# Patient Record
Sex: Male | Born: 2000 | Race: White | Hispanic: No | Marital: Married | Smoking: Never smoker
Health system: Southern US, Community
[De-identification: ages and names within clinical notes are randomized; demographics above are authoritative.]

## PROBLEM LIST (undated history)

## (undated) DIAGNOSIS — I498 Other specified cardiac arrhythmias: Secondary | ICD-10-CM

## (undated) DIAGNOSIS — R000 Tachycardia, unspecified: Secondary | ICD-10-CM

## (undated) DIAGNOSIS — G90A Postural orthostatic tachycardia syndrome (POTS): Secondary | ICD-10-CM

## (undated) DIAGNOSIS — I079 Rheumatic tricuspid valve disease, unspecified: Secondary | ICD-10-CM

## (undated) DIAGNOSIS — I951 Orthostatic hypotension: Secondary | ICD-10-CM

## (undated) DIAGNOSIS — J45909 Unspecified asthma, uncomplicated: Secondary | ICD-10-CM

---
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
## 2019-05-15 ENCOUNTER — Other Ambulatory Visit: Payer: Self-pay

## 2019-05-15 ENCOUNTER — Ambulatory Visit (HOSPITAL_COMMUNITY)
Admission: EM | Admit: 2019-05-15 | Discharge: 2019-05-15 | Disposition: A | Discharge status: Home / Self Care | Payer: Medicaid Other | Attending: Family Medicine | Admitting: Family Medicine

## 2019-05-15 ENCOUNTER — Encounter (HOSPITAL_COMMUNITY): Payer: Self-pay | Admitting: Emergency Medicine

## 2019-05-15 DIAGNOSIS — Z20822 Contact with and (suspected) exposure to covid-19: Secondary | ICD-10-CM

## 2019-05-15 DIAGNOSIS — Z20828 Contact with and (suspected) exposure to other viral communicable diseases: Secondary | ICD-10-CM | POA: Insufficient documentation
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
## 2019-05-15 NOTE — Discharge Instructions (Addendum)
If your Covid-19 test is positive, you will get a phone call from [facility] regarding your results. If your Covid-19 test is negative, you will NOT get a phone call from [facility] with your results. You may view your results on MyChart. If you do not have a MyChart account, sign up instructions are in your discharge papers. ° °

## 2019-05-15 NOTE — ED Notes (Signed)
Specimen in lab 

## 2019-05-16 NOTE — ED Provider Notes (Signed)
Robert Crane   [id number] 05/15/19 Arrival Time: 1847  ASSESSMENT & PLAN:  1. Exposure to COVID-19 virus     COVID-19 testing sent. To self-quarantine until results are available.  Follow-up Information    Remi Haggard, FNP.   Specialty: Family Medicine Why: As needed. Contact information: [place] [address] [phone]           Reviewed expectations re: course of current medical issues. Questions answered. Outlined signs and symptoms indicating need for more acute intervention. Patient verbalized understanding. After Visit Summary given.   SUBJECTIVE: History from: patient. Robert Crane is a 18 y.o. male who requests COVID-19 testing. Known COVID-19 contact: questions if co-worker tested positive. Recent travel: none. Denies: runny nose, congestion, fever, cough, sore throat, difficulty breathing and headache.  ROS: As per HPI.   OBJECTIVE:  Vitals:   05/15/19 1917  BP: 122/70  Pulse: 77  Resp: 16  Temp: 99.4 F (37.4 C)  TempSrc: Oral  SpO2: 100%    General appearance: alert; no distress Eyes: PERRLA; EOMI; conjunctiva normal HENT: [place]; AT; nasal mucosa normal; oral mucosa normal Neck: supple  Lungs: clear to auscultation bilaterally; unlabored Heart: regular rate and rhythm Abdomen: soft, non-tender Extremities: no edema Skin: warm and dry Neurologic: normal gait Psychological: alert and cooperative; normal mood and affect  Labs: Labs Reviewed  NOVEL CORONAVIRUS, NAA (HOSP ORDER, SEND-OUT TO REF LAB; TAT 18-24 HRS)     No Known Allergies  Past Medical History:  Diagnosis Date  . Asthma   . POTS (postural orthostatic tachycardia syndrome)   . Tricuspid valvular disorder    Social History   Socioeconomic History  . Marital status: Married    Spouse name: Not on file  . Number of children: Not on file  . Years of education: Not on file  . Highest education level: Not on file  Occupational  History  . Not on file  Social Needs  . Financial resource strain: Not on file  . Food insecurity    Worry: Not on file    Inability: Not on file  . Transportation needs    Medical: Not on file    Non-medical: Not on file  Tobacco Use  . Smoking status: Never Smoker  . Smokeless tobacco: Never Used  Substance and Sexual Activity  . Alcohol use: No  . Drug use: No  . Sexual activity: Not on file  Lifestyle  . Physical activity    Days per week: Not on file    Minutes per session: Not on file  . Stress: Not on file  Relationships  . Social Herbalist on phone: Not on file    Gets together: Not on file    Attends religious service: Not on file    Active member of club or organization: Not on file    Attends meetings of clubs or organizations: Not on file    Relationship status: Not on file  . Intimate partner violence    Fear of current or ex partner: Not on file    Emotionally abused: Not on file    Physically abused: Not on file    Forced sexual activity: Not on file  Other Topics Concern  . Not on file  Social History Narrative  . Not on file   Family History  Problem Relation Age of Onset  . Healthy Mother    History reviewed. No pertinent surgical history.   Vanessa Kick, MD 05/16/19  0935  

## 2019-05-18 LAB — NOVEL CORONAVIRUS, NAA (HOSP ORDER, SEND-OUT TO REF LAB; TAT 18-24 HRS): SARS-CoV-2, NAA: NOT DETECTED

## 2019-05-22 ENCOUNTER — Other Ambulatory Visit: Payer: Self-pay

## 2019-05-22 ENCOUNTER — Encounter (HOSPITAL_COMMUNITY): Payer: Self-pay

## 2019-05-22 ENCOUNTER — Ambulatory Visit (HOSPITAL_COMMUNITY)
Admission: EM | Admit: 2019-05-22 | Discharge: 2019-05-22 | Disposition: A | Discharge status: Home / Self Care | Payer: Medicaid Other | Attending: Family Medicine | Admitting: Family Medicine

## 2019-05-22 DIAGNOSIS — J069 Acute upper respiratory infection, unspecified: Secondary | ICD-10-CM | POA: Diagnosis not present

## 2019-05-22 MED ORDER — BENZONATATE 200 MG PO CAPS: 200.0000 mg | ORAL_CAPSULE | Freq: Three times a day (TID) | ORAL | 0 refills | Status: AC | PRN

## 2019-05-22 MED ORDER — FLUTICASONE PROPIONATE 50 MCG/ACT NA SUSP: 1.0000 | Freq: Every day | NASAL | 0 refills | Status: DC

## 2019-05-22 MED ORDER — PREDNISONE 50 MG PO TABS: 50.0000 mg | ORAL_TABLET | Freq: Every day | ORAL | 0 refills | Status: AC

## 2019-05-22 MED ORDER — CETIRIZINE HCL 10 MG PO CAPS: 10.0000 mg | ORAL_CAPSULE | Freq: Every day | ORAL | 0 refills | Status: DC

## 2019-05-22 MED ORDER — DM-GUAIFENESIN ER 30-600 MG PO TB12: 1.0000 | ORAL_TABLET | Freq: Two times a day (BID) | ORAL | 0 refills | Status: DC

## 2019-05-22 NOTE — ED Triage Notes (Signed)
Patient presents to Urgent Care with complaints of sore throat and productive cough since being seen last week. Patient reports he just tested negative for COVID but he still feels badly.

## 2019-05-22 NOTE — Discharge Instructions (Signed)
Please begin daily cetirizine to help with congestion and drainage contributing to sore throat Flonase nasal spray 1 to 2 sprays in each nostril daily Tessalon as needed for cough every 8 hours Mucinex DM twice daily as needed for further congestion/cough Please hold onto prescription for prednisone, may fill if developing worsening wheezing/shortness of breath/asthma Use albuterol inhaler as needed for shortness of breath and wheezing  Please rest and drink plenty of fluids  Please follow-up if symptoms not resolving, worsening, developing fevers, difficulty breathing, shortness of breath, persistent symptoms

## 2019-05-22 NOTE — ED Provider Notes (Signed)
[facility]    CSN: [id number] Arrival date & time: 05/22/19  1733      History   Chief Complaint Chief Complaint  Patient presents with   Sore Throat    negative COVID    HPI [name] is a 18 y.o. male history of asthma, POTS, presenting today for evaluation of cough and sore throat.  Patient states that 2 days ago he developed a sore throat as well as a cough.  Cough has been occasionally productive.  Sore throat is worse in the morning, improves through the day.  He was seen here approximately 1 week ago for Covid exposure, at the time he did not have any symptoms.  He has had some hot and cold chills and felt slightly achy, but denies any known fevers.  He denies shortness of breath, but does note that he has history of asthma.  He is concerned about his symptoms turning into bronchitis/pneumonia.  He has not had significant wheezing or shortness of breath at this time.  He has been using a Mucus Relief tablet as well as DayQuil without relief.  HPI  Past Medical History:  Diagnosis Date   Asthma    POTS (postural orthostatic tachycardia syndrome)    Tricuspid valvular disorder     There are no active problems to display for this patient.   History reviewed. No pertinent surgical history.     Home Medications    Prior to Admission medications   Medication Sig Start Date End Date Taking? Authorizing Provider  midodrine (PROAMATINE) 5 MG tablet Take 5 mg by mouth 3 (three) times daily with meals.   Yes [provider]  PROPRANOLOL HCL PO Take by mouth.   Yes [provider]  albuterol (PROVENTIL HFA;VENTOLIN HFA) 108 (90 BASE) MCG/ACT inhaler Inhale 2 puffs into the lungs every 6 (six) hours as needed for wheezing or shortness of breath.     [provider]  AMITRIPTYLINE HCL PO Take by mouth.    [provider]  atomoxetine (STRATTERA) 80 MG capsule Take 80 mg by mouth daily.    [provider]    benzonatate (TESSALON) 200 MG capsule Take 1 capsule (200 mg total) by mouth 3 (three) times daily as needed for up to 7 days for cough. 05/22/19 05/29/19  [name], PA-C  Cetirizine HCl 10 MG CAPS Take 1 capsule (10 mg total) by mouth daily for 15 days. 05/22/19 06/06/19  [name] C, PA-C  cholecalciferol (VITAMIN D) 1000 UNITS tablet Take 2,000 Units by mouth daily.    [provider]  cyclobenzaprine (FLEXERIL) 10 MG tablet Take 1 tablet (10 mg total) by mouth 2 (two) times daily as needed for muscle spasms. 07/14/17   [name], [place], PA-C  dextromethorphan-guaiFENesin [facility](MUCINEX DM) 30-600 MG 12hr tablet Take 1 tablet by mouth 2 (two) times daily. 05/22/19   [name], PA-C  fludrocortisone (FLORINEF) 0.1 MG tablet Take 0.1 mg by mouth 2 (two) times daily.    [provider]  fluticasone (FLONASE) 50 MCG/ACT nasal spray Place 1-2 sprays into both nostrils daily for 7 days. 05/22/19 05/29/19  [name] C, PA-C  ibuprofen (ADVIL,MOTRIN) 600 MG tablet Take 1 tablet (600 mg total) by mouth every 6 (six) hours as needed. 07/14/17   [name], [place], PA-C  Melatonin 5 MG CAPS Take 5-10 mg by mouth at bedtime as needed (sleep).    [provider]  Multiple Vitamin (MULTIVITAMIN) tablet Take 1 tablet  by mouth daily.    [provider]  NON FORMULARY Anti inflam    [provider]  predniSONE (DELTASONE) 50 MG tablet Take 1 tablet (50 mg total) by mouth daily for 5 days. 05/22/19 05/27/19  [name], [name], PA-C    Family History Family History  Problem Relation Age of Onset   Healthy Mother     Social History Social History   Tobacco Use   Smoking status: Never Smoker   Smokeless tobacco: Never Used  Substance Use Topics   Alcohol use: No   Drug use: No     Allergies   Patient has no known allergies.   Review of Systems Review of Systems  Constitutional: Positive for chills and fatigue. Negative for activity change,  appetite change and fever.  HENT: Positive for congestion and sore throat. Negative for ear pain, rhinorrhea, sinus pressure and trouble swallowing.   Eyes: Negative for discharge and redness.  Respiratory: Positive for cough. Negative for chest tightness and shortness of breath.   Cardiovascular: Negative for chest pain.  Gastrointestinal: Negative for abdominal pain, diarrhea, nausea and vomiting.  Musculoskeletal: Negative for myalgias.  Skin: Negative for rash.  Neurological: Negative for dizziness, light-headedness and headaches.     Physical Exam Triage Vital Signs ED Triage Vitals  Enc Vitals Group     BP 05/22/19 1753 118/62     Pulse Rate 05/22/19 1753 65     Resp 05/22/19 1753 16     Temp 05/22/19 1753 99.2 F (37.3 C)     Temp Source 05/22/19 1753 Oral     SpO2 05/22/19 1753 100 %     Weight --      Height --      Head Circumference --      Peak Flow --      Pain Score 05/22/19 1750 0     Pain Loc --      Pain Edu? --      Excl. in GC? --    No data found.  Updated Vital Signs BP 118/62 (BP Location: Left Arm)    Pulse 65    Temp 99.2 F (37.3 C) (Oral)    Resp 16    SpO2 100%   Visual Acuity Right Eye Distance:   Left Eye Distance:   Bilateral Distance:    Right Eye Near:   Left Eye Near:    Bilateral Near:     Physical Exam Vitals signs and nursing note reviewed.  Constitutional:      Appearance: He is well-developed.  HENT:     Head: Normocephalic and atraumatic.     Ears:     Comments: Bilateral ears without tenderness to palpation of external auricle, tragus and mastoid, EAC's without erythema or swelling, TM's with good bony landmarks and cone of light. Non erythematous.     Nose:     Comments: Nose mucosa erythematous, swollen turbinates bilaterally    Mouth/Throat:     Comments: Oral mucosa pink and moist, no tonsillar enlargement or exudate. Posterior pharynx patent and nonerythematous, no uvula deviation or swelling. Normal  phonation. Eyes:     Conjunctiva/sclera: Conjunctivae normal.  Neck:     Musculoskeletal: Neck supple.  Cardiovascular:     Rate and Rhythm: Normal rate and regular rhythm.     Heart sounds: No murmur.  Pulmonary:     Effort: Pulmonary effort is normal. No respiratory distress.     Breath sounds: Normal breath sounds.  Comments: Breathing comfortably at rest, CTABL, no wheezing, rales or other adventitious sounds auscultated Abdominal:     Palpations: Abdomen is soft.     Tenderness: There is no abdominal tenderness.  Skin:    General: Skin is warm and dry.  Neurological:     Mental Status: He is alert.      UC Treatments / Results  Labs (all labs ordered are listed, but only abnormal results are displayed) Labs Reviewed - No data to display  EKG   Radiology No results found.  Procedures Procedures (including critical care time)  Medications Ordered in UC Medications - No data to display  Initial Impression / Assessment and Plan / UC Course  I have reviewed the triage vital signs and the nursing notes.  Pertinent labs & imaging results that were available during my care of the patient were reviewed by me and considered in my medical decision making (see chart for details).    Sore throat and cough x2 days, vital signs stable without fever, tachycardia or hypoxia.  Covid swab 1 week ago negative, no new exposures, will not repeat given less than 2 weeks.  Likely viral etiology and recommending symptomatic and supportive care at this time.  Exam not suggestive of bronchitis or pneumonia, advised to continue to monitor.  Initiating on Mucinex DM, Tessalon and Zyrtec, may try Flonase as well.  Provided prescription for prednisone to fill in the case symptoms progressed more into wheezing/congestion/typical bronchitis symptoms.  Use albuterol inhaler which she has at home as needed for shortness of breath and wheezing in the interim.  Discussed strict return precautions.  Patient verbalized understanding and is agreeable with plan.   Final Clinical Impressions(s) / UC Diagnoses   Final diagnoses:  Viral URI with cough     Discharge Instructions     Please begin daily cetirizine to help with congestion and drainage contributing to sore throat Flonase nasal spray 1 to 2 sprays in each nostril daily Tessalon as needed for cough every 8 hours Mucinex DM twice daily as needed for further congestion/cough Please hold onto prescription for prednisone, may fill if developing worsening wheezing/shortness of breath/asthma Use albuterol inhaler as needed for shortness of breath and wheezing  Please rest and drink plenty of fluids  Please follow-up if symptoms not resolving, worsening, developing fevers, difficulty breathing, shortness of breath, persistent symptoms   ED Prescriptions    Medication Sig Dispense Auth. Provider   benzonatate (TESSALON) 200 MG capsule Take 1 capsule (200 mg total) by mouth 3 (three) times daily as needed for up to 7 days for cough. 28 capsule Stepehn Eckard C, PA-C   dextromethorphan-guaiFENesin (MUCINEX DM) 30-600 MG 12hr tablet Take 1 tablet by mouth 2 (two) times daily. 20 tablet Judieth Mckown C, PA-C   fluticasone (FLONASE) 50 MCG/ACT nasal spray Place 1-2 sprays into both nostrils daily for 7 days. 1 g Ken Bonn C, PA-C   Cetirizine HCl 10 MG CAPS Take 1 capsule (10 mg total) by mouth daily for 15 days. 15 capsule Lourdes Kucharski C, PA-C   predniSONE (DELTASONE) 50 MG tablet Take 1 tablet (50 mg total) by mouth daily for 5 days. 5 tablet Luanna Weesner, [place] C, PA-C     PDMP not reviewed this encounter.   Janith Lima, [place] 05/22/19 1906

## 2019-11-25 ENCOUNTER — Encounter (HOSPITAL_COMMUNITY): Payer: Self-pay

## 2019-11-25 ENCOUNTER — Emergency Department (HOSPITAL_COMMUNITY): Payer: Medicaid Other

## 2019-11-25 ENCOUNTER — Emergency Department (HOSPITAL_COMMUNITY)
Admission: EM | Admit: 2019-11-25 | Discharge: 2019-11-25 | Disposition: A | Discharge status: Home / Self Care | Payer: Medicaid Other | Attending: Emergency Medicine | Admitting: Emergency Medicine

## 2019-11-25 ENCOUNTER — Other Ambulatory Visit: Payer: Self-pay

## 2019-11-25 DIAGNOSIS — S59902A Unspecified injury of left elbow, initial encounter: Secondary | ICD-10-CM | POA: Diagnosis present

## 2019-11-25 DIAGNOSIS — Y92832 Beach as the place of occurrence of the external cause: Secondary | ICD-10-CM | POA: Diagnosis not present

## 2019-11-25 DIAGNOSIS — J45909 Unspecified asthma, uncomplicated: Secondary | ICD-10-CM | POA: Diagnosis not present

## 2019-11-25 DIAGNOSIS — Y9351 Activity, roller skating (inline) and skateboarding: Secondary | ICD-10-CM | POA: Diagnosis not present

## 2019-11-25 DIAGNOSIS — Z79899 Other long term (current) drug therapy: Secondary | ICD-10-CM | POA: Insufficient documentation

## 2019-11-25 DIAGNOSIS — S52125A Nondisplaced fracture of head of left radius, initial encounter for closed fracture: Secondary | ICD-10-CM | POA: Insufficient documentation

## 2019-11-25 DIAGNOSIS — Y999 Unspecified external cause status: Secondary | ICD-10-CM | POA: Diagnosis not present

## 2019-11-25 NOTE — ED Notes (Signed)
Pt transported to xray 

## 2019-11-25 NOTE — ED Provider Notes (Signed)
[facility] EMERGENCY DEPARTMENT Provider Note   CSN: [id number] Arrival date & time: 11/25/19  1000     History Chief Complaint  Patient presents with  . Elbow Injury    LEDFORD GOODSON is a 19 y.o. male.  HPI HPI Comments: SEANMICHAEL SALMONS is a 19 y.o. male who presents to the Emergency Department complaining of a fall that occurred 2 days ago.  Patient [place] the [place] and tripped and fell on his left arm.  He was evaluated at an urgent care and had his left elbow and wrist x-rayed.  We do not have access to his records.  He states he broke his left elbow.  His left upper extremity was splinted and wrapped.  He states for the past 2 days he has been experiencing waxing and waning 5/10 pain in the left elbow.  He has been taking Tylenol and ibuprofen with moderate relief.  He feels as if though his splint "did not set right".  He request reevaluation as well as orthopedic referral.  He reports an episode of tingling in the fingers of his left hand yesterday and his mother loosened the wrap on his left wrist which alleviated the symptoms.  He denies any other complaints at this time.     Past Medical History:  Diagnosis Date  . Asthma   . POTS (postural orthostatic tachycardia syndrome)   . Tricuspid valvular disorder     There are no problems to display for this patient.   History reviewed. No pertinent surgical history.     Family History  Problem Relation Age of Onset  . Healthy Mother     Social History   Tobacco Use  . Smoking status: Never Smoker  . Smokeless tobacco: Never Used  Substance Use Topics  . Alcohol use: No  . Drug use: No    Home Medications Prior to Admission medications   Medication Sig Start Date End Date Taking? Authorizing Provider  albuterol (PROVENTIL HFA;VENTOLIN HFA) 108 (90 BASE) MCG/ACT inhaler Inhale 2 puffs into the lungs every 6 (six) hours as needed for wheezing or shortness of breath.      [provider]  AMITRIPTYLINE HCL PO Take by mouth.    [provider]  atomoxetine (STRATTERA) 80 MG capsule Take 80 mg by mouth daily.    [provider]  Cetirizine HCl 10 MG CAPS Take 1 capsule (10 mg total) by mouth daily for 15 days. 05/22/19 06/06/19  Wieters, Hallie C, PA-C  cholecalciferol (VITAMIN D) 1000 UNITS tablet Take 2,000 Units by mouth daily.    [provider]  cyclobenzaprine (FLEXERIL) 10 MG tablet Take 1 tablet (10 mg total) by mouth 2 (two) times daily as needed for muscle spasms. 07/14/17   Fayrene Helper, PA-C  dextromethorphan-guaiFENesin [facility] DM) 30-600 MG 12hr tablet Take 1 tablet by mouth 2 (two) times daily. 05/22/19   Wieters, Hallie C, PA-C  fludrocortisone (FLORINEF) 0.1 MG tablet Take 0.1 mg by mouth 2 (two) times daily.    [provider]  fluticasone (FLONASE) 50 MCG/ACT nasal spray Place 1-2 sprays into both nostrils daily for 7 days. 05/22/19 05/29/19  Wieters, Hallie C, PA-C  ibuprofen (ADVIL,MOTRIN) 600 MG tablet Take 1 tablet (600 mg total) by mouth every 6 (six) hours as needed. 07/14/17   Fayrene Helper, PA-C  Melatonin 5 MG CAPS Take 5-10 mg by mouth at bedtime as needed (sleep).    [provider]  midodrine (PROAMATINE) 5  MG tablet Take 5 mg by mouth 3 (three) times daily with meals.    [provider]  Multiple Vitamin (MULTIVITAMIN) tablet Take 1 tablet by mouth daily.    [provider]  NON FORMULARY Anti inflam    [provider]  PROPRANOLOL HCL PO Take by mouth.    [provider]    Allergies    Patient has no known allergies.  Review of Systems   Review of Systems  Musculoskeletal: Positive for arthralgias and myalgias.  Skin: Negative for color change and wound.  Neurological: Negative for weakness and numbness.   Physical Exam Updated Vital Signs BP 139/73 (BP Location: Right Arm)   Pulse 73   Temp 98.2 F (36.8 C) (Oral)   Resp 14   Ht 6'  (1.829 m)   Wt 68.9 kg   SpO2 100%   BMI 20.61 kg/m   Physical Exam Vitals and nursing note reviewed.  Constitutional:      General: He is not in acute distress.    Appearance: Normal appearance. He is normal weight. He is not ill-appearing, toxic-appearing or diaphoretic.  HENT:     Head: Normocephalic and atraumatic.     Right Ear: External ear normal.     Left Ear: External ear normal.     Nose: Nose normal.     Mouth/Throat:     Pharynx: Oropharynx is clear.  Eyes:     Extraocular Movements: Extraocular movements intact.  Cardiovascular:     Rate and Rhythm: Normal rate.     Pulses: Normal pulses.  Pulmonary:     Effort: Pulmonary effort is normal.  Abdominal:     General: Abdomen is flat.     Tenderness: There is no abdominal tenderness.  Musculoskeletal:        General: Signs of injury present.     Cervical back: Normal range of motion.     Comments: Left upper extremity splinted and wrapped from the mid humerus to the left wrist.  Good cap refill in all fingers of the left hand.  Distal sensation is intact.  Patient able to move all the fingers of the left hand spontaneously and without difficulty.  Grip strength is 5 out of 5 bilaterally.  Skin:    General: Skin is warm and dry.     Capillary Refill: Capillary refill takes less than 2 seconds.  Neurological:     General: No focal deficit present.     Mental Status: He is alert and oriented to person, place, and time.  Psychiatric:        Mood and Affect: Mood normal.        Behavior: Behavior normal.    ED Results / Procedures / Treatments   Labs (all labs ordered are listed, but only abnormal results are displayed) Labs Reviewed - No data to display  EKG None  Radiology DG Elbow Complete Left  Result Date: 11/25/2019 CLINICAL DATA:  Elbow fracture 2 days ago, revaluation EXAM: LEFT ELBOW - COMPLETE 3+ VIEW COMPARISON:  None FINDINGS: Bone detail limited by fiberglass cast material. Large elbow joint  effusion present. Joint spaces preserved. Question nondisplaced intra-articular radial head fracture. No additional fracture, dislocation or bone destruction identified. IMPRESSION: Question nondisplaced intra-articular radial head fracture with associated elbow joint effusion. Electronically Signed   By: Lavonia Dana M.D.   On: 11/25/2019 12:41   Procedures Procedures   Medications Ordered in ED Medications - No data to display  ED Course  I  have reviewed the triage vital signs and the nursing notes.  Pertinent labs & imaging results that were available during my care of the patient were reviewed by me and considered in my medical decision making (see chart for details).    MDM Rules/Calculators/A&P                      Patient is a pleasant 19 year old male who presents with his mother due to a left elbow fracture.  He fell off a skateboard 2 days ago while at the beach.  He was initially evaluated in urgent care in the area and was diagnosed with a left elbow fracture and had his left upper extremity splinted and wrapped from the proximal elbow to the wrist.  He presents today for reevaluation as well as orthopedic referral.  Physical exam is reassuring.  Distal sensation is intact in all fingers of the left hand.  Good cap refill.  2+ radial pulses.  Patient is able to move the fingers of his left hand spontaneously without difficulty.  I obtained new imaging of the left elbow showing questionable nondisplaced intra-articular radial head fracture with associated elbow effusion.  I discussed this with the patient and his mother.  I gave him a referral to orthopedics in the area.  I recommended continued ice on the elbow as tolerated.  He understands to keep the splint clean and dry.  Tylenol and ibuprofen for continued pain management.  They are going to follow-up with orthopedics today.  Their questions were answered and they were amicable at the time of discharge.  His vital signs are  stable.  Patient discharged to home/self care.  Condition at discharge: Stable  Note: Portions of this report may have been transcribed using voice recognition software. Every effort was made to ensure accuracy; however, inadvertent computerized transcription errors may be present.    Final Clinical Impression(s) / ED Diagnoses Final diagnoses:  Closed nondisplaced fracture of head of left radius, initial encounter    Rx / DC Orders ED Discharge Orders    None       Placido Sou, PA-C 11/25/19 1308    Pricilla Loveless, MD 11/26/19 [phone]

## 2019-11-25 NOTE — ED Triage Notes (Signed)
Pt fractured his left elbow while skateboarding at the beach on Saturday. Pt has fracture with splint in place but pts thinks the splint has not set in the right position. Pt c.o increased pain and numbness in his fingers.

## 2019-11-25 NOTE — Discharge Instructions (Addendum)
Per our discussion, I would recommend Tylenol and ibuprofen as needed for pain management.  I would recommend a low-dose of both at the same time.  400 mg of ibuprofen and 325 mg of Tylenol.  Do not take this on an empty stomach.  Continue to apply ice as tolerated.  Please follow-up with your orthopedic referral today.  If you develop numbness or tingling or any new or worsening symptoms please do not hesitate to return to the emergency department.  It was a pleasure to meet you.

## 2020-05-20 ENCOUNTER — Other Ambulatory Visit: Payer: Self-pay

## 2020-05-20 ENCOUNTER — Emergency Department (HOSPITAL_COMMUNITY)
Admission: EM | Admit: 2020-05-20 | Discharge: 2020-05-20 | Disposition: A | Discharge status: Home / Self Care | Payer: Medicaid Other | Attending: Emergency Medicine | Admitting: Emergency Medicine

## 2020-05-20 DIAGNOSIS — J45909 Unspecified asthma, uncomplicated: Secondary | ICD-10-CM | POA: Insufficient documentation

## 2020-05-20 DIAGNOSIS — Z20822 Contact with and (suspected) exposure to covid-19: Secondary | ICD-10-CM | POA: Diagnosis not present

## 2020-05-20 DIAGNOSIS — J069 Acute upper respiratory infection, unspecified: Secondary | ICD-10-CM | POA: Diagnosis not present

## 2020-05-20 DIAGNOSIS — R197 Diarrhea, unspecified: Secondary | ICD-10-CM | POA: Diagnosis not present

## 2020-05-20 DIAGNOSIS — R112 Nausea with vomiting, unspecified: Secondary | ICD-10-CM | POA: Diagnosis not present

## 2020-05-20 DIAGNOSIS — M791 Myalgia, unspecified site: Secondary | ICD-10-CM | POA: Diagnosis not present

## 2020-05-20 DIAGNOSIS — R509 Fever, unspecified: Secondary | ICD-10-CM | POA: Diagnosis present

## 2020-05-20 DIAGNOSIS — Z7951 Long term (current) use of inhaled steroids: Secondary | ICD-10-CM | POA: Insufficient documentation

## 2020-05-20 LAB — RESPIRATORY PANEL BY RT PCR (FLU A&B, COVID)
Influenza A by PCR: NEGATIVE
Influenza B by PCR: NEGATIVE
SARS Coronavirus 2 by RT PCR: NEGATIVE

## 2020-05-20 NOTE — Discharge Instructions (Signed)
You have been seen here for URI like symptoms.  I recommend taking Tylenol for fever control and ibuprofen for pain control please follow dosing on the back of bottle.  I recommend staying hydrated and if you do not an appetite, I recommend soups as this will provide you with fluids and calories.  Your Covid test is pending I recommend self quarantine until you get your results back on MyChart.  If you are Covid positive you must self quarantine for 10 days starting on symptom onset.  I would like you to contact "post Covid care" as they will provide you with information how to manage your Covid symptoms.  Follow-up with PCP if symptoms last over 7 days and you are Covid negative.    Come back to the emergency department if you develop chest pain, shortness of breath, severe abdominal pain, uncontrolled nausea, vomiting, diarrhea.

## 2020-05-20 NOTE — ED Triage Notes (Signed)
Pt here with co fever and generalized body aches that was a sudden onset last night. Pt states he took some tylenol this morning abt 8. Pt states fevers were 99-100.1 last night. Pt afebrile at this time. Pt states he is not vaccinated for covid and denies covid exposure.

## 2020-05-20 NOTE — ED Notes (Signed)
Pt given dc instructions pt verbalizes understanding.  

## 2020-05-20 NOTE — ED Provider Notes (Signed)
[facility] EMERGENCY DEPARTMENT Provider Note   CSN: [id number] Arrival date & time: 05/20/20  2297     History Chief Complaint  Patient presents with  . Fever  . Chills    Robert Crane is a 19 y.o. male.  HPI   Patient with significant medical history of pots and tricuspid valve disorder presents to the emergency department with chief complaint of URI-like symptoms.  Patient states symptoms started last night.  Endorses fevers, chills, nasal congestion, general body aches, nausea without vomiting, diarrhea and lack of appetite.  States he is not Covid vaccinated, denies recent sick contacts or recent travels.  He has been taking ibuprofen without any relief.  He states he has been tolerating food and water without any difficulty.  Patient denies headache, sore throat, cough, chest pain, shortness of breath, abdominal pain, vomiting, dysuria, pedal edema.  Past Medical History:  Diagnosis Date  . Asthma   . POTS (postural orthostatic tachycardia syndrome)   . Tricuspid valvular disorder     There are no problems to display for this patient.   No past surgical history on file.     Family History  Problem Relation Age of Onset  . Healthy Mother     Social History   Tobacco Use  . Smoking status: Never Smoker  . Smokeless tobacco: Never Used  Vaping Use  . Vaping Use: Never used  Substance Use Topics  . Alcohol use: No  . Drug use: No    Home Medications Prior to Admission medications   Medication Sig Start Date End Date Taking? Authorizing Provider  albuterol (PROVENTIL HFA;VENTOLIN HFA) 108 (90 BASE) MCG/ACT inhaler Inhale 2 puffs into the lungs every 6 (six) hours as needed for wheezing or shortness of breath.     [provider]  AMITRIPTYLINE HCL PO Take by mouth.    [provider]  atomoxetine (STRATTERA) 80 MG capsule Take 80 mg by mouth daily.    [provider]  Cetirizine HCl 10 MG CAPS Take 1 capsule  (10 mg total) by mouth daily for 15 days. 05/22/19 06/06/19  Wieters, Hallie C, PA-C  cholecalciferol (VITAMIN D) 1000 UNITS tablet Take 2,000 Units by mouth daily.    [provider]  cyclobenzaprine (FLEXERIL) 10 MG tablet Take 1 tablet (10 mg total) by mouth 2 (two) times daily as needed for muscle spasms. 07/14/17   Fayrene Helper, PA-C  dextromethorphan-guaiFENesin [facility] DM) 30-600 MG 12hr tablet Take 1 tablet by mouth 2 (two) times daily. 05/22/19   Wieters, Hallie C, PA-C  fludrocortisone (FLORINEF) 0.1 MG tablet Take 0.1 mg by mouth 2 (two) times daily.    [provider]  fluticasone (FLONASE) 50 MCG/ACT nasal spray Place 1-2 sprays into both nostrils daily for 7 days. 05/22/19 05/29/19  Wieters, Hallie C, PA-C  ibuprofen (ADVIL,MOTRIN) 600 MG tablet Take 1 tablet (600 mg total) by mouth every 6 (six) hours as needed. 07/14/17   Fayrene Helper, PA-C  Melatonin 5 MG CAPS Take 5-10 mg by mouth at bedtime as needed (sleep).    [provider]  midodrine (PROAMATINE) 5 MG tablet Take 5 mg by mouth 3 (three) times daily with meals.    [provider]  Multiple Vitamin (MULTIVITAMIN) tablet Take 1 tablet by mouth daily.    [provider]  NON FORMULARY Anti inflam    [provider]  PROPRANOLOL HCL PO Take by mouth.    [provider]  Allergies    Patient has no known allergies.  Review of Systems   Review of Systems  Constitutional: Positive for appetite change, chills and fever.  HENT: Positive for congestion. Negative for sore throat, trouble swallowing and voice change.   Eyes: Negative for visual disturbance.  Respiratory: Negative for cough and shortness of breath.   Cardiovascular: Negative for chest pain and palpitations.  Gastrointestinal: Positive for diarrhea and nausea. Negative for abdominal pain and vomiting.  Genitourinary: Negative for enuresis, flank pain, frequency, penile swelling, scrotal swelling and  testicular pain.  Musculoskeletal: Negative for back pain.  Skin: Negative for rash.  Neurological: Negative for dizziness and headaches.  Hematological: Does not bruise/bleed easily.    Physical Exam Updated Vital Signs BP 121/69 (BP Location: Right Arm)   Pulse 72   Temp 98.3 F (36.8 C) (Oral)   Resp 17   SpO2 100%   Physical Exam Vitals and nursing note reviewed.  Constitutional:      General: He is not in acute distress.    Appearance: He is not ill-appearing.  HENT:     Head: Normocephalic and atraumatic.     Nose: Congestion present.     Mouth/Throat:     Mouth: Mucous membranes are moist.     Pharynx: Oropharynx is clear. No oropharyngeal exudate or posterior oropharyngeal erythema.  Eyes:     General: No scleral icterus. Cardiovascular:     Rate and Rhythm: Normal rate and regular rhythm.     Pulses: Normal pulses.     Heart sounds: No murmur heard.  No friction rub. No gallop.   Pulmonary:     Effort: No respiratory distress.     Breath sounds: No wheezing, rhonchi or rales.  Abdominal:     General: There is no distension.     Palpations: Abdomen is soft.     Tenderness: There is no abdominal tenderness. There is no right CVA tenderness, left CVA tenderness or guarding.  Musculoskeletal:        General: No swelling or tenderness.     Right lower leg: No edema.     Left lower leg: No edema.  Skin:    General: Skin is warm and dry.     Capillary Refill: Capillary refill takes less than 2 seconds.     Findings: No rash.  Neurological:     Mental Status: He is alert.  Psychiatric:        Mood and Affect: Mood normal.     ED Results / Procedures / Treatments   Labs (all labs ordered are listed, but only abnormal results are displayed) Labs Reviewed  RESPIRATORY PANEL BY RT PCR (FLU A&B, COVID)    EKG None  Radiology No results found.  Procedures Procedures (including critical care time)  Medications Ordered in ED Medications - No data to  display  ED Course  I have reviewed the triage vital signs and the nursing notes.  Pertinent labs & imaging results that were available during my care of the patient were reviewed by me and considered in my medical decision making (see chart for details).    MDM Rules/Calculators/A&P                          Patient presents with URI-like symptoms.  He is alert, did not produce stress, vital signs reassuring.  Will order respiratory panel for further evaluation.  Respiratory panel negative for Covid, influenza a/B.  Low suspicion for  systemic infection as patient is nontoxic-appearing, vital signs reassuring, no obvious source infection noted on exam.  Low suspicion for pneumonia as lung sounds are clear bilaterally.  I have low suspicion for PE as patient denies pleuritic chest pain, shortness of breath, patient is PERC. low suspicion for strep throat as oropharynx was visualized, no erythema or exudates noted.  Low suspicion patient would need  hospitalized due to viral infection or Covid as vital signs reassuring, patient is not in respiratory distress.  I suspect patient suffering from a URI, will recommend Covid precautions and over-the-counter pain medications.  If Covid positive will refer him to infusion clinic due to POTS and tricuspid disease.  Vital signs have remained stable, no indication for hospital admission.  Patient given at home care as well strict return precautions.  Patient verbalized that they understood agreed to said plan.   Final Clinical Impression(s) / ED Diagnoses Final diagnoses:  Viral upper respiratory tract infection    Rx / DC Orders ED Discharge Orders    None       Carroll Sage, PA-C 05/20/20 1118    Mancel Bale, MD 05/20/20 1657

## 2022-04-19 ENCOUNTER — Ambulatory Visit
Admission: EM | Admit: 2022-04-19 | Discharge: 2022-04-19 | Disposition: A | Discharge status: Home / Self Care | Payer: BC Managed Care – PPO | Attending: Physician Assistant | Admitting: Physician Assistant

## 2022-04-19 DIAGNOSIS — K529 Noninfective gastroenteritis and colitis, unspecified: Secondary | ICD-10-CM | POA: Diagnosis not present

## 2022-04-19 DIAGNOSIS — J069 Acute upper respiratory infection, unspecified: Secondary | ICD-10-CM | POA: Diagnosis not present

## 2022-04-19 DIAGNOSIS — R1084 Generalized abdominal pain: Secondary | ICD-10-CM | POA: Diagnosis not present

## 2022-04-19 DIAGNOSIS — Z1152 Encounter for screening for COVID-19: Secondary | ICD-10-CM | POA: Insufficient documentation

## 2022-04-19 DIAGNOSIS — K30 Functional dyspepsia: Secondary | ICD-10-CM | POA: Diagnosis not present

## 2022-04-19 DIAGNOSIS — R112 Nausea with vomiting, unspecified: Secondary | ICD-10-CM | POA: Diagnosis not present

## 2022-04-19 MED ORDER — ONDANSETRON 4 MG PO TBDP
4.0000 mg | ORAL_TABLET | Freq: Once | ORAL | Status: AC
  Administered 2022-04-19: 4 mg via ORAL

## 2022-04-19 MED ORDER — ONDANSETRON HCL 4 MG PO TABS: 4.0000 mg | ORAL_TABLET | Freq: Three times a day (TID) | ORAL | 0 refills | Status: DC | PRN

## 2022-04-19 NOTE — ED Triage Notes (Signed)
Pt c/o of nausea, vominting, stomachache, and body ache, along with sinus pressure

## 2022-04-19 NOTE — Discharge Instructions (Signed)
Advised take the Zofran every 6 hours on a regular basis to help control the abdominal cramping and nausea. COVID test will be completed in 48 hours, if you do not hear from this office in that timeframe it indicates the test is negative.  Go on MyChart to review the test results when it post in 48 to 72 hours. Advised clear liquids-Sprite-ginger ale-7-Up-then break into a bland diet over the next 24 hours with chicken broth, toast, crackers, chicken or soup. Is to follow-up with PCP or return to urgent care if symptoms fail to improve.

## 2022-04-19 NOTE — ED Provider Notes (Signed)
[facility]    CSN: [id number] Arrival date & time: 04/19/22  1434      History   Chief Complaint Chief Complaint  Patient presents with   Nausea   Abdominal Pain    HPI LORIS WINROW is a 21 y.o. male.   21 year old male presents with abdominal pain, vomiting.  Patient indicates that he got off work yesterday started feeling nauseous and having some stomach cramping and pain.  He relates that when he got home he was going to eat some breakfast but when he mailed the sausage it made him nauseated and he started throwing up.  He relates that he is also had some mild chills, sinus congestion, with low-grade fever.  He relates that he still continues to have stomach cramping, pain and discomfort.  He denies having any diarrhea.  He does relate having fatigue and weakness since his symptoms started.  He is tolerating fluids well.  He denies being around any coworkers that have been sick, and he has not eaten any foods that were questionable.   Abdominal Pain   Past Medical History:  Diagnosis Date   Asthma    POTS (postural orthostatic tachycardia syndrome)    Tricuspid valvular disorder     There are no problems to display for this patient.   History reviewed. No pertinent surgical history.     Home Medications    Prior to Admission medications   Medication Sig Start Date End Date Taking? Authorizing Provider  ondansetron (ZOFRAN) 4 MG tablet Take 1 tablet (4 mg total) by mouth every 8 (eight) hours as needed for nausea or vomiting. 04/19/22  Yes Ellsworth Lennox, PA-C  albuterol (PROVENTIL HFA;VENTOLIN HFA) 108 (90 BASE) MCG/ACT inhaler Inhale 2 puffs into the lungs every 6 (six) hours as needed for wheezing or shortness of breath.     [provider]  AMITRIPTYLINE HCL PO Take by mouth.    [provider]  atomoxetine (STRATTERA) 80 MG capsule Take 80 mg by mouth daily.    [provider]  Cetirizine HCl 10 MG CAPS Take 1 capsule  (10 mg total) by mouth daily for 15 days. 05/22/19 06/06/19  Wieters, Hallie C, PA-C  cholecalciferol (VITAMIN D) 1000 UNITS tablet Take 2,000 Units by mouth daily.    [provider]  cyclobenzaprine (FLEXERIL) 10 MG tablet Take 1 tablet (10 mg total) by mouth 2 (two) times daily as needed for muscle spasms. 07/14/17   Fayrene Helper, PA-C  dextromethorphan-guaiFENesin [facility] DM) 30-600 MG 12hr tablet Take 1 tablet by mouth 2 (two) times daily. 05/22/19   Wieters, Hallie C, PA-C  fludrocortisone (FLORINEF) 0.1 MG tablet Take 0.1 mg by mouth 2 (two) times daily.    [provider]  fluticasone (FLONASE) 50 MCG/ACT nasal spray Place 1-2 sprays into both nostrils daily for 7 days. 05/22/19 05/29/19  Wieters, Hallie C, PA-C  ibuprofen (ADVIL,MOTRIN) 600 MG tablet Take 1 tablet (600 mg total) by mouth every 6 (six) hours as needed. 07/14/17   Fayrene Helper, PA-C  Melatonin 5 MG CAPS Take 5-10 mg by mouth at bedtime as needed (sleep).    [provider]  midodrine (PROAMATINE) 5 MG tablet Take 5 mg by mouth 3 (three) times daily with meals.    [provider]  Multiple Vitamin (MULTIVITAMIN) tablet Take 1 tablet by mouth daily.    [provider]  NON FORMULARY Anti inflam    [provider]  PROPRANOLOL HCL PO Take by  mouth.    [provider]    Family History Family History  Problem Relation Age of Onset   Healthy Mother     Social History Social History   Tobacco Use   Smoking status: Never   Smokeless tobacco: Never  Vaping Use   Vaping Use: Never used  Substance Use Topics   Alcohol use: No   Drug use: No     Allergies   Patient has no known allergies.   Review of Systems Review of Systems  Gastrointestinal:  Positive for abdominal pain (all over).     Physical Exam Triage Vital Signs ED Triage Vitals  Enc Vitals Group     BP 04/19/22 1449 114/75     Pulse Rate 04/19/22 1449 95     Resp --      Temp 04/19/22  1449 98.2 F (36.8 C)     Temp Source 04/19/22 1449 Oral     SpO2 04/19/22 1449 98 %     Weight --      Height --      Head Circumference --      Peak Flow --      Pain Score 04/19/22 1447 4     Pain Loc --      Pain Edu? --      Excl. in [place]? --    No data found.  Updated Vital Signs BP 114/75 (BP Location: Right Arm)   Pulse 95   Temp 98.2 F (36.8 C) (Oral)   SpO2 98%   Visual Acuity Right Eye Distance:   Left Eye Distance:   Bilateral Distance:    Right Eye Near:   Left Eye Near:    Bilateral Near:     Physical Exam Constitutional:      Appearance: He is well-developed.  HENT:     Right Ear: Tympanic membrane and ear canal normal.     Left Ear: Tympanic membrane and ear canal normal.     Mouth/Throat:     Mouth: Mucous membranes are moist.     Pharynx: Oropharynx is clear.  Cardiovascular:     Rate and Rhythm: Normal rate and regular rhythm.     Heart sounds: Normal heart sounds.  Pulmonary:     Effort: Pulmonary effort is normal.     Breath sounds: Normal breath sounds and air entry. No wheezing, rhonchi or rales.  Abdominal:     General: Abdomen is flat. Bowel sounds are normal.     Palpations: Abdomen is soft.     Tenderness: There is no abdominal tenderness.  Lymphadenopathy:     Cervical: No cervical adenopathy.  Neurological:     Mental Status: He is alert.      UC Treatments / Results  Labs (all labs ordered are listed, but only abnormal results are displayed) Labs Reviewed  SARS CORONAVIRUS 2 (TAT 6-24 HRS)    EKG   Radiology No results found.  Procedures Procedures (including critical care time)  Medications Ordered in UC Medications  ondansetron (ZOFRAN-ODT) disintegrating tablet 4 mg (4 mg Oral Given 04/19/22 1512)    Initial Impression / Assessment and Plan / UC Course  I have reviewed the triage vital signs and the nursing notes.  Pertinent labs & imaging results that were available during my care of the patient were  reviewed by me and considered in my medical decision making (see chart for details).    Plan: The abdominal pain and vomiting will be treated with the following:  1.  Advised take the Zofran every 6 hours on a regular basis to control the stomach cramping and pain. 2.  COVID test is pending due to symptoms presented with GI upset and upper respiratory infection. 3.  Advised to follow-up PCP or return to urgent care if symptoms fail to improve.   Final Clinical Impressions(s) / UC Diagnoses   Final diagnoses:  Generalized abdominal pain  Noninfectious gastroenteritis, unspecified type  Nausea and vomiting, unspecified vomiting type     Discharge Instructions      Advised take the Zofran every 6 hours on a regular basis to help control the abdominal cramping and nausea. COVID test will be completed in 48 hours, if you do not hear from this office in that timeframe it indicates the test is negative.  Go on MyChart to review the test results when it post in 48 to 72 hours. Advised clear liquids-Sprite-ginger ale-7-Up-then break into a bland diet over the next 24 hours with chicken broth, toast, crackers, chicken or soup. Is to follow-up with PCP or return to urgent care if symptoms fail to improve.    ED Prescriptions     Medication Sig Dispense Auth. Provider   ondansetron (ZOFRAN) 4 MG tablet Take 1 tablet (4 mg total) by mouth every 8 (eight) hours as needed for nausea or vomiting. 20 tablet Ellsworth Lennox, PA-C      PDMP not reviewed this encounter.   Ellsworth Lennox, PA-C 04/19/22 1517

## 2022-04-20 LAB — SARS CORONAVIRUS 2 (TAT 6-24 HRS): SARS Coronavirus 2: NEGATIVE

## 2022-06-15 ENCOUNTER — Other Ambulatory Visit: Payer: Self-pay

## 2022-06-15 ENCOUNTER — Encounter (HOSPITAL_COMMUNITY): Payer: Self-pay | Admitting: *Deleted

## 2022-06-15 ENCOUNTER — Ambulatory Visit (HOSPITAL_COMMUNITY)
Admission: EM | Admit: 2022-06-15 | Discharge: 2022-06-15 | Disposition: A | Discharge status: Home / Self Care | Payer: BC Managed Care – PPO | Attending: Internal Medicine | Admitting: Internal Medicine

## 2022-06-15 DIAGNOSIS — Z202 Contact with and (suspected) exposure to infections with a predominantly sexual mode of transmission: Secondary | ICD-10-CM

## 2022-06-15 LAB — HIV ANTIBODY (ROUTINE TESTING W REFLEX): HIV Screen 4th Generation wRfx: NONREACTIVE

## 2022-06-15 MED ORDER — CEFTRIAXONE SODIUM 500 MG IJ SOLR
500.0000 mg | INTRAMUSCULAR | Status: DC
  Administered 2022-06-15: 500 mg via INTRAMUSCULAR

## 2022-06-15 MED ORDER — CEFTRIAXONE SODIUM 500 MG IJ SOLR
INTRAMUSCULAR | Status: AC
  Filled 2022-06-15: qty 500

## 2022-06-15 MED ORDER — LIDOCAINE HCL (PF) 1 % IJ SOLN
INTRAMUSCULAR | Status: AC
  Filled 2022-06-15: qty 2

## 2022-06-15 NOTE — Discharge Instructions (Signed)
Your STD testing has been sent to the lab and will come back in the next 2 to 3 days.  We will call you if any of your results are positive requiring treatment and treat you at that time.   You were given an injection called ceftriaxone to treat likely gonorrhea today since you were exposed.  Avoid sexual intercourse for 7 days while you are being treated to prevent spread of STD.  Condom use is the best way to prevent spread of STDs.  Return to urgent care as needed.

## 2022-06-15 NOTE — ED Triage Notes (Signed)
Pt reports his partner tested positive for STD. Pt reports he does not have any Sx's.

## 2022-06-15 NOTE — ED Provider Notes (Signed)
Robert Crane    CSN: [id number] Arrival date & time: 06/15/22  1453      History   Chief Complaint Chief Complaint  Patient presents with   Exposure to STD    HPI Robert Crane is a 21 y.o. male.   Patient presents to urgent care for screening testing for STDs after exposure to gonorrhea via his girlfriend.  Girlfriend states she has not been sexually active with anyone else and patient has never been tested for STDs in the past.  He is not currently experiencing any penile discharge, rash, abdominal pain, dysuria, urinary frequency, urinary hesitancy, nausea, fever/chills, diarrhea, or rectal discomfort.  Patient denies recent new sexual partners.  Denies history of STD and herpes simplex.   Exposure to STD    Past Medical History:  Diagnosis Date   Asthma    POTS (postural orthostatic tachycardia syndrome)    Tricuspid valvular disorder     There are no problems to display for this patient.   History reviewed. No pertinent surgical history.     Home Medications    Prior to Admission medications   Medication Sig Start Date End Date Taking? Authorizing Provider  albuterol (PROVENTIL HFA;VENTOLIN HFA) 108 (90 BASE) MCG/ACT inhaler Inhale 2 puffs into the lungs every 6 (six) hours as needed for wheezing or shortness of breath.     [provider]  AMITRIPTYLINE HCL PO Take by mouth.    [provider]  atomoxetine (STRATTERA) 80 MG capsule Take 80 mg by mouth daily.    [provider]  Cetirizine HCl 10 MG CAPS Take 1 capsule (10 mg total) by mouth daily for 15 days. 05/22/19 06/06/19  Wieters, Hallie C, PA-C  cholecalciferol (VITAMIN D) 1000 UNITS tablet Take 2,000 Units by mouth daily.    [provider]  cyclobenzaprine (FLEXERIL) 10 MG tablet Take 1 tablet (10 mg total) by mouth 2 (two) times daily as needed for muscle spasms. 07/14/17   Domenic Moras, PA-C  dextromethorphan-guaiFENesin [facility] DM) 30-600 MG 12hr  tablet Take 1 tablet by mouth 2 (two) times daily. 05/22/19   Wieters, Hallie C, PA-C  fludrocortisone (FLORINEF) 0.1 MG tablet Take 0.1 mg by mouth 2 (two) times daily.    [provider]  fluticasone (FLONASE) 50 MCG/ACT nasal spray Place 1-2 sprays into both nostrils daily for 7 days. 05/22/19 05/29/19  Wieters, Hallie C, PA-C  ibuprofen (ADVIL,MOTRIN) 600 MG tablet Take 1 tablet (600 mg total) by mouth every 6 (six) hours as needed. 07/14/17   Domenic Moras, PA-C  Melatonin 5 MG CAPS Take 5-10 mg by mouth at bedtime as needed (sleep).    [provider]  midodrine (PROAMATINE) 5 MG tablet Take 5 mg by mouth 3 (three) times daily with meals.    [provider]  Multiple Vitamin (MULTIVITAMIN) tablet Take 1 tablet by mouth daily.    [provider]  NON FORMULARY Anti inflam    [provider]  ondansetron (ZOFRAN) 4 MG tablet Take 1 tablet (4 mg total) by mouth every 8 (eight) hours as needed for nausea or vomiting. 04/19/22   Nyoka Lint, PA-C  PROPRANOLOL HCL PO Take by mouth.    [provider]    Family History Family History  Problem Relation Age of Onset   Healthy Mother     Social History Social History   Tobacco Use   Smoking status: Never   Smokeless tobacco: Never  Vaping Use   Vaping Use:  Never used  Substance Use Topics   Alcohol use: No   Drug use: No     Allergies   Patient has no known allergies.   Review of Systems Review of Systems Per HPI  Physical Exam Triage Vital Signs ED Triage Vitals  Enc Vitals Group     BP 06/15/22 1549 105/73     Pulse Rate 06/15/22 1549 69     Resp 06/15/22 1549 18     Temp 06/15/22 1549 98.2 F (36.8 C)     Temp src --      SpO2 06/15/22 1549 97 %     Weight --      Height --      Head Circumference --      Peak Flow --      Pain Score 06/15/22 1547 0     Pain Loc --      Pain Edu? --      Excl. in [place]? --    No data found.  Updated Vital Signs BP 105/73    Pulse 69   Temp 98.2 F (36.8 C)   Resp 18   SpO2 97%   Visual Acuity Right Eye Distance:   Left Eye Distance:   Bilateral Distance:    Right Eye Near:   Left Eye Near:    Bilateral Near:     Physical Exam Vitals and nursing note reviewed.  Constitutional:      Appearance: He is not ill-appearing or toxic-appearing.  HENT:     Head: Normocephalic and atraumatic.     Right Ear: Hearing and external ear normal.     Left Ear: Hearing and external ear normal.     Nose: Nose normal.     Mouth/Throat:     Lips: Pink.  Eyes:     General: Lids are normal. Vision grossly intact. Gaze aligned appropriately.     Extraocular Movements: Extraocular movements intact.     Conjunctiva/sclera: Conjunctivae normal.  Pulmonary:     Effort: Pulmonary effort is normal.  Genitourinary:    Comments: Deferred. Musculoskeletal:     Cervical back: Neck supple.  Skin:    General: Skin is warm and dry.     Capillary Refill: Capillary refill takes less than 2 seconds.     Findings: No rash.  Neurological:     General: No focal deficit present.     Mental Status: He is alert and oriented to person, place, and time. Mental status is at baseline.     Cranial Nerves: No dysarthria or facial asymmetry.  Psychiatric:        Mood and Affect: Mood normal.        Speech: Speech normal.        Behavior: Behavior normal.        Thought Content: Thought content normal.        Judgment: Judgment normal.      UC Treatments / Results  Labs (all labs ordered are listed, but only abnormal results are displayed) Labs Reviewed  HIV ANTIBODY (ROUTINE TESTING W REFLEX)  RPR  CYTOLOGY, (ORAL, ANAL, URETHRAL) ANCILLARY ONLY    EKG   Radiology No results found.  Procedures Procedures (including critical care time)  Medications Ordered in UC Medications  cefTRIAXone (ROCEPHIN) injection 500 mg (has no administration in time range)    Initial Impression / Assessment and Plan / UC Course  I  have reviewed the triage vital signs and the nursing notes.  Pertinent labs & imaging  results that were available during my care of the patient were reviewed by me and considered in my medical decision making (see chart for details).   STD screening  STI labs pending.  Patient would like HIV and syphilis testing today.  Will notify patient of positive results and treat accordingly when labs come back.  Treatment for gonorrhea initiated today due to positive exposure, patient would like to not have to come back to the clinic if gonorrhea testing is positive for his treatment.  500 mg ceftriaxone administered in clinic.   Patient to avoid sexual intercourse until screening testing comes back.  Education provided regarding safe sexual practices and patient encouraged to use protection to prevent spread of STIs.   Discussed physical exam and available lab work findings in clinic with patient.  Counseled patient regarding appropriate use of medications and potential side effects for all medications recommended or prescribed today. Discussed red flag signs and symptoms of worsening condition,when to call the PCP office, return to urgent care, and when to seek higher level of care in the emergency department. Patient verbalizes understanding and agreement with plan. All questions answered. Patient discharged in stable condition.   Final Clinical Impressions(s) / UC Diagnoses   Final diagnoses:  STD exposure     Discharge Instructions      Your STD testing has been sent to the lab and will come back in the next 2 to 3 days.  We will call you if any of your results are positive requiring treatment and treat you at that time.   You were given an injection called ceftriaxone to treat likely gonorrhea today since you were exposed.  Avoid sexual intercourse for 7 days while you are being treated to prevent spread of STD.  Condom use is the best way to prevent spread of STDs.  Return to urgent care  as needed.    ED Prescriptions   None    PDMP not reviewed this encounter.   Talbot Grumbling, [place] 06/15/22 [phone]

## 2022-06-16 LAB — CYTOLOGY, (ORAL, ANAL, URETHRAL) ANCILLARY ONLY
Chlamydia: NEGATIVE
Comment: NEGATIVE
Comment: NEGATIVE
Comment: NORMAL
Neisseria Gonorrhea: NEGATIVE
Trichomonas: NEGATIVE

## 2022-06-16 LAB — RPR: RPR Ser Ql: NONREACTIVE

## 2022-07-07 ENCOUNTER — Encounter (HOSPITAL_COMMUNITY): Payer: Self-pay

## 2022-07-07 ENCOUNTER — Ambulatory Visit
Admission: EM | Admit: 2022-07-07 | Discharge: 2022-07-07 | Discharge status: Against Medical Advice | Payer: BC Managed Care – PPO

## 2022-07-07 ENCOUNTER — Ambulatory Visit (HOSPITAL_COMMUNITY)
Admission: EM | Admit: 2022-07-07 | Discharge: 2022-07-07 | Disposition: A | Discharge status: Home / Self Care | Payer: BC Managed Care – PPO | Attending: Family Medicine | Admitting: Family Medicine

## 2022-07-07 DIAGNOSIS — Z1152 Encounter for screening for COVID-19: Secondary | ICD-10-CM | POA: Insufficient documentation

## 2022-07-07 DIAGNOSIS — J4521 Mild intermittent asthma with (acute) exacerbation: Secondary | ICD-10-CM | POA: Diagnosis not present

## 2022-07-07 DIAGNOSIS — J069 Acute upper respiratory infection, unspecified: Secondary | ICD-10-CM

## 2022-07-07 DIAGNOSIS — R0981 Nasal congestion: Secondary | ICD-10-CM | POA: Diagnosis not present

## 2022-07-07 DIAGNOSIS — R059 Cough, unspecified: Secondary | ICD-10-CM | POA: Diagnosis not present

## 2022-07-07 MED ORDER — BENZONATATE 100 MG PO CAPS: 100.0000 mg | ORAL_CAPSULE | Freq: Three times a day (TID) | ORAL | 0 refills | Status: DC | PRN

## 2022-07-07 MED ORDER — PREDNISONE 20 MG PO TABS: 40.0000 mg | ORAL_TABLET | Freq: Every day | ORAL | 0 refills | Status: AC

## 2022-07-07 MED ORDER — ALBUTEROL SULFATE HFA 108 (90 BASE) MCG/ACT IN AERS: 2.0000 | INHALATION_SPRAY | RESPIRATORY_TRACT | 0 refills | Status: DC | PRN

## 2022-07-07 NOTE — ED Triage Notes (Signed)
Pt reports coughing x 3 days.  Pt reports he is coughing up mucous.

## 2022-07-07 NOTE — ED Notes (Signed)
Sars obtained by Dorthula Rue

## 2022-07-07 NOTE — ED Provider Notes (Signed)
[facility]    CSN: [id number] Arrival date & time: 07/07/22  1948      History   Chief Complaint Chief Complaint  Patient presents with   Cough   Nasal Congestion    HPI Robert Crane is a 21 y.o. male.    Cough  Here for a 2-day history of nasal congestion and cough.  He is begun feeling tight and wheezy in his chest.  He has had asthma and bronchitis previously.  No fever or chills.  No nausea, vomiting, or diarrhea.  Past Medical History:  Diagnosis Date   Asthma    POTS (postural orthostatic tachycardia syndrome)    Tricuspid valvular disorder     There are no problems to display for this patient.   History reviewed. No pertinent surgical history.     Home Medications    Prior to Admission medications   Medication Sig Start Date End Date Taking? Authorizing Provider  albuterol (VENTOLIN HFA) 108 (90 Base) MCG/ACT inhaler Inhale 2 puffs into the lungs every 4 (four) hours as needed for wheezing or shortness of breath. 07/07/22  Yes Barrett Henle, MD  benzonatate (TESSALON) 100 MG capsule Take 1 capsule (100 mg total) by mouth 3 (three) times daily as needed for cough. 07/07/22  Yes Barrett Henle, MD  predniSONE (DELTASONE) 20 MG tablet Take 2 tablets (40 mg total) by mouth daily with breakfast for 5 days. 07/07/22 07/12/22 Yes Barrett Henle, MD  AMITRIPTYLINE HCL PO Take by mouth.    [provider]  atomoxetine (STRATTERA) 80 MG capsule Take 80 mg by mouth daily.    [provider]  Cetirizine HCl 10 MG CAPS Take 1 capsule (10 mg total) by mouth daily for 15 days. 05/22/19 06/06/19  Wieters, Hallie C, PA-C  cholecalciferol (VITAMIN D) 1000 UNITS tablet Take 2,000 Units by mouth daily.    [provider]  cyclobenzaprine (FLEXERIL) 10 MG tablet Take 1 tablet (10 mg total) by mouth 2 (two) times daily as needed for muscle spasms. 07/14/17   Domenic Moras, PA-C  fludrocortisone (FLORINEF) 0.1 MG tablet Take  0.1 mg by mouth 2 (two) times daily.    [provider]  fluticasone (FLONASE) 50 MCG/ACT nasal spray Place 1-2 sprays into both nostrils daily for 7 days. 05/22/19 05/29/19  Wieters, Hallie C, PA-C  Melatonin 5 MG CAPS Take 5-10 mg by mouth at bedtime as needed (sleep).    [provider]  midodrine (PROAMATINE) 5 MG tablet Take 5 mg by mouth 3 (three) times daily with meals.    [provider]  Multiple Vitamin (MULTIVITAMIN) tablet Take 1 tablet by mouth daily.    [provider]  NON FORMULARY Anti inflam    [provider]  PROPRANOLOL HCL PO Take by mouth.    [provider]    Family History Family History  Problem Relation Age of Onset   Healthy Mother     Social History Social History   Tobacco Use   Smoking status: Never   Smokeless tobacco: Never  Vaping Use   Vaping Use: Never used  Substance Use Topics   Alcohol use: No   Drug use: No     Allergies   Patient has no known allergies.   Review of Systems Review of Systems  Respiratory:  Positive for cough.      Physical Exam Triage Vital Signs ED Triage Vitals [07/07/22 2108]  Enc Vitals Group  BP 114/81     Pulse Rate 97     Resp 18     Temp 98.1 F (36.7 C)     Temp Source Oral     SpO2 98 %     Weight      Height      Head Circumference      Peak Flow      Pain Score      Pain Loc      Pain Edu?      Excl. in [place]?    No data found.  Updated Vital Signs BP 114/81 (BP Location: Left Arm)   Pulse 97   Temp 98.1 F (36.7 C) (Oral)   Resp 18   SpO2 98%   Visual Acuity Right Eye Distance:   Left Eye Distance:   Bilateral Distance:    Right Eye Near:   Left Eye Near:    Bilateral Near:     Physical Exam Vitals reviewed.  Constitutional:      General: He is not in acute distress.    Appearance: He is not toxic-appearing.  HENT:     Right Ear: Tympanic membrane and ear canal normal.     Left Ear: Tympanic membrane and ear  canal normal.     Nose: Nose normal.     Mouth/Throat:     Mouth: Mucous membranes are moist.     Comments: Clear mucus draining in the posterior oropharynx is mildly erythematous. Eyes:     Extraocular Movements: Extraocular movements intact.     Conjunctiva/sclera: Conjunctivae normal.     Pupils: Pupils are equal, round, and reactive to light.  Cardiovascular:     Rate and Rhythm: Normal rate and regular rhythm.     Heart sounds: No murmur heard. Pulmonary:     Effort: No respiratory distress.     Breath sounds: No stridor. No wheezing, rhonchi or rales.  Musculoskeletal:     Cervical back: Neck supple.  Lymphadenopathy:     Cervical: No cervical adenopathy.  Skin:    Capillary Refill: Capillary refill takes less than 2 seconds.     Coloration: Skin is not jaundiced or pale.  Neurological:     General: No focal deficit present.     Mental Status: He is alert and oriented to person, place, and time.  Psychiatric:        Behavior: Behavior normal.      UC Treatments / Results  Labs (all labs ordered are listed, but only abnormal results are displayed) Labs Reviewed  SARS CORONAVIRUS 2 (TAT 6-24 HRS)    EKG   Radiology No results found.  Procedures Procedures (including critical care time)  Medications Ordered in UC Medications - No data to display  Initial Impression / Assessment and Plan / UC Course  I have reviewed the triage vital signs and the nursing notes.  Pertinent labs & imaging results that were available during my care of the patient were reviewed by me and considered in my medical decision making (see chart for details).        I am going to treat for asthma exacerbation.  He is swabbed for COVID and if positive he is a candidate for Paxlovid renal dosing.  We do not have an EGFR calculated in the last 7 years Final Clinical Impressions(s) / UC Diagnoses   Final diagnoses:  Viral URI with cough  Mild intermittent asthma with acute  exacerbation     Discharge Instructions  Albuterol inhaler--do 2 puffs every 4 hours as needed for shortness of breath or wheezing  Take prednisone 20 mg--2 daily for 5 days  Take benzonatate 100 mg, 1 tab every 8 hours as needed for cough.   You have been swabbed for COVID, and the test will result in the next 24 hours. Our staff will call you if positive. If the COVID test is positive, you should quarantine for 5 days from the start of your symptoms      ED Prescriptions     Medication Sig Dispense Auth. Provider   albuterol (VENTOLIN HFA) 108 (90 Base) MCG/ACT inhaler Inhale 2 puffs into the lungs every 4 (four) hours as needed for wheezing or shortness of breath. 1 each Barrett Henle, MD   predniSONE (DELTASONE) 20 MG tablet Take 2 tablets (40 mg total) by mouth daily with breakfast for 5 days. 10 tablet Barrett Henle, MD   benzonatate (TESSALON) 100 MG capsule Take 1 capsule (100 mg total) by mouth 3 (three) times daily as needed for cough. 21 capsule Barrett Henle, MD      PDMP not reviewed this encounter.   Barrett Henle, MD 07/07/22 2121

## 2022-07-07 NOTE — Discharge Instructions (Signed)
Albuterol inhaler--do 2 puffs every 4 hours as needed for shortness of breath or wheezing  Take prednisone 20 mg--2 daily for 5 days  Take benzonatate 100 mg, 1 tab every 8 hours as needed for cough.   You have been swabbed for COVID, and the test will result in the next 24 hours. Our staff will call you if positive. If the COVID test is positive, you should quarantine for 5 days from the start of your symptoms  

## 2022-07-08 LAB — SARS CORONAVIRUS 2 (TAT 6-24 HRS): SARS Coronavirus 2: NEGATIVE

## 2022-07-27 ENCOUNTER — Ambulatory Visit
Admission: EM | Admit: 2022-07-27 | Discharge: 2022-07-27 | Disposition: A | Discharge status: Home / Self Care | Payer: BC Managed Care – PPO | Attending: Internal Medicine | Admitting: Internal Medicine

## 2022-07-27 DIAGNOSIS — R112 Nausea with vomiting, unspecified: Secondary | ICD-10-CM

## 2022-07-27 DIAGNOSIS — J45909 Unspecified asthma, uncomplicated: Secondary | ICD-10-CM | POA: Diagnosis not present

## 2022-07-27 MED ORDER — ALBUTEROL SULFATE HFA 108 (90 BASE) MCG/ACT IN AERS: 2.0000 | INHALATION_SPRAY | RESPIRATORY_TRACT | 0 refills | Status: DC | PRN

## 2022-07-27 MED ORDER — ONDANSETRON 4 MG PO TBDP
4.0000 mg | ORAL_TABLET | Freq: Once | ORAL | Status: AC
  Administered 2022-07-27: 4 mg via ORAL

## 2022-07-27 MED ORDER — ONDANSETRON 4 MG PO TBDP: 4.0000 mg | ORAL_TABLET | Freq: Three times a day (TID) | ORAL | 0 refills | Status: DC | PRN

## 2022-07-27 NOTE — ED Triage Notes (Signed)
Pt c/o nausea, vomiting,   Denies diarrhea, constipation, abd pain   Onset ~ today

## 2022-07-28 ENCOUNTER — Encounter: Payer: Self-pay | Admitting: Physician Assistant

## 2022-07-28 NOTE — ED Provider Notes (Signed)
[facility]    CSN: [id number] Arrival date & time: 07/27/22  1937      History   Chief Complaint Chief Complaint  Patient presents with   Emesis    HPI Robert Crane is a 22 y.o. male.   Patient here today for evaluation of nausea and vomiting that started today. He has not had any diarrhea. He denies any abdominal pain. He has not had any fever. He denies any congestion and cough. He has not taken any medication for symptoms.  Patient requests refill of albuterol inhaler if possible.   The history is provided by the patient.  Emesis Associated symptoms: no abdominal pain, no chills, no cough, no diarrhea and no fever     Past Medical History:  Diagnosis Date   Asthma    POTS (postural orthostatic tachycardia syndrome)    Tricuspid valvular disorder     There are no problems to display for this patient.   History reviewed. No pertinent surgical history.     Home Medications    Prior to Admission medications   Medication Sig Start Date End Date Taking? Authorizing Provider  ondansetron (ZOFRAN-ODT) 4 MG disintegrating tablet Take 1 tablet (4 mg total) by mouth every 8 (eight) hours as needed. 07/27/22  Yes Francene Finders, PA-C  albuterol (VENTOLIN HFA) 108 (90 Base) MCG/ACT inhaler Inhale 2 puffs into the lungs every 4 (four) hours as needed for wheezing or shortness of breath. 07/27/22   Francene Finders, PA-C  midodrine (PROAMATINE) 5 MG tablet Take 5 mg by mouth 3 (three) times daily with meals.    [provider]  Multiple Vitamin (MULTIVITAMIN) tablet Take 1 tablet by mouth daily.    [provider]  NON FORMULARY Anti inflam    [provider]  PROPRANOLOL HCL PO Take by mouth.    [provider]    Family History Family History  Problem Relation Age of Onset   Healthy Mother     Social History Social History   Tobacco Use   Smoking status: Never   Smokeless tobacco: Never  Vaping Use    Vaping Use: Never used  Substance Use Topics   Alcohol use: No   Drug use: No     Allergies   Patient has no known allergies.   Review of Systems Review of Systems  Constitutional:  Negative for chills and fever.  HENT:  Negative for congestion and rhinorrhea.   Eyes:  Negative for discharge and redness.  Respiratory:  Negative for cough and shortness of breath.   Gastrointestinal:  Positive for nausea and vomiting. Negative for abdominal pain and diarrhea.  Neurological:  Negative for numbness.     Physical Exam Triage Vital Signs ED Triage Vitals [07/27/22 2010]  Enc Vitals Group     BP 121/76     Pulse Rate 85     Resp 18     Temp 98.3 F (36.8 C)     Temp Source Oral     SpO2 97 %     Weight      Height      Head Circumference      Peak Flow      Pain Score 0     Pain Loc      Pain Edu?      Excl. in [place]?    No data found.  Updated Vital Signs BP 121/76 (BP Location: Left Arm)   Pulse 85   Temp 98.3  F (36.8 C) (Oral)   Resp 18   SpO2 97%     Physical Exam Vitals and nursing note reviewed.  Constitutional:      General: He is not in acute distress.    Appearance: Normal appearance. He is not ill-appearing.  HENT:     Head: Normocephalic and atraumatic.  Eyes:     Conjunctiva/sclera: Conjunctivae normal.  Cardiovascular:     Rate and Rhythm: Normal rate and regular rhythm.     Heart sounds: Normal heart sounds.  Pulmonary:     Effort: Pulmonary effort is normal. No respiratory distress.     Breath sounds: Normal breath sounds. No wheezing, rhonchi or rales.  Abdominal:     General: Abdomen is flat. Bowel sounds are normal. There is no distension.     Palpations: Abdomen is soft.     Tenderness: There is no abdominal tenderness. There is no guarding or rebound.  Neurological:     Mental Status: He is alert.  Psychiatric:        Mood and Affect: Mood normal.        Behavior: Behavior normal.        Thought Content: Thought content normal.       UC Treatments / Results  Labs (all labs ordered are listed, but only abnormal results are displayed) Labs Reviewed - No data to display  EKG   Radiology No results found.  Procedures Procedures (including critical care time)  Medications Ordered in UC Medications  ondansetron (ZOFRAN-ODT) disintegrating tablet 4 mg (4 mg Oral Given 07/27/22 2013)    Initial Impression / Assessment and Plan / UC Course  I have reviewed the triage vital signs and the nursing notes.  Pertinent labs & imaging results that were available during my care of the patient were reviewed by me and considered in my medical decision making (see chart for details).    Suspect viral etiology of symptoms. Will trial zofran for nausea, Encouraged follow up if no gradual improvement over the next few days or sooner with worsening.   Albuterol inhaler refilled as requested.   Final Clinical Impressions(s) / UC Diagnoses   Final diagnoses:  Nausea and vomiting, unspecified vomiting type  Uncomplicated asthma, unspecified asthma severity, unspecified whether persistent   Discharge Instructions   None    ED Prescriptions     Medication Sig Dispense Auth. Provider   albuterol (VENTOLIN HFA) 108 (90 Base) MCG/ACT inhaler Inhale 2 puffs into the lungs every 4 (four) hours as needed for wheezing or shortness of breath. 1 each Francene Finders, PA-C   ondansetron (ZOFRAN-ODT) 4 MG disintegrating tablet Take 1 tablet (4 mg total) by mouth every 8 (eight) hours as needed. 20 tablet Francene Finders, PA-C      PDMP not reviewed this encounter.   Francene Finders, PA-C 07/28/22 1140

## 2022-08-15 ENCOUNTER — Ambulatory Visit (INDEPENDENT_AMBULATORY_CARE_PROVIDER_SITE_OTHER): Payer: BC Managed Care – PPO | Admitting: Family Medicine

## 2022-08-15 ENCOUNTER — Encounter: Payer: Self-pay | Admitting: Family Medicine

## 2022-08-15 VITALS — BP 110/72 | HR 75 | Ht 72.0 in | Wt 150.5 lb

## 2022-08-15 DIAGNOSIS — G47419 Narcolepsy without cataplexy: Secondary | ICD-10-CM

## 2022-08-15 DIAGNOSIS — G90A Postural orthostatic tachycardia syndrome (POTS): Secondary | ICD-10-CM

## 2022-08-15 DIAGNOSIS — Z0000 Encounter for general adult medical examination without abnormal findings: Secondary | ICD-10-CM | POA: Diagnosis not present

## 2022-08-15 NOTE — Assessment & Plan Note (Signed)
Episodes described by patient are most consistent with sleep attacks. Syncope and seizures cannot be ruled out however patient does not have prodromal symptoms or post-ictal symptoms. Neuro exam reassuring, and VSS. Suspect possible narcolepsy. Given frequency and chronicity, will refer to neurology. Patient given instructions to not drive or operate heavy machinery as it is a safety risk. Plan to follow-up after Neurology visit.

## 2022-08-15 NOTE — Patient Instructions (Signed)
It was great to see you! Thank you for allowing me to participate in your care!  I recommend that you always bring your medications to each appointment as this makes it easy to ensure we are on the correct medications and helps [place] not miss when refills are needed.  Our plans for today:  - I have made a referral to neurology. They should call you to schedule an appointment.   Please arrive 15 minutes PRIOR to your next scheduled appointment time! If you do not, this affects OTHER patients' care.  Take care and seek immediate care sooner if you develop any concerns.   Dr. Salvadore Oxford, MD [place]

## 2022-08-15 NOTE — Progress Notes (Signed)
Patient name: Robert Crane Medical record number: [id number] Date of Birth: [birth year] Age: 22 y.o. Gender: male  Robert Crane is a 22 y.o. male here to establish care.   Subjective  Concerns today:  Sleep - Falls asleep mid conversation. Happens every day. Sometimes happen out of nowhere. Sometimes falls as a result of falling asleep. Does not have lightheaded or dizziness. No shaking or seizures. Ongoing for 4 years. Sometimes confusion afterwards. No known seizures. Episodes of being asleep last for 3-58mnutes typically.   Medications: Midodrine - 182m5x per day, propranolol 14m28mwice per day, but has not taken for long time. Unsure of reason why he is taking it.  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  Constitutional:      General: He is not in acute distress.    Appearance: Normal appearance. He is normal weight.  HENT:     Head: Normocephalic and atraumatic.     Right Ear: External ear normal.     Left Ear: External ear normal.     Nose: Nose normal.     Mouth/Throat:     Mouth: Mucous membranes are moist.  Eyes:     Extraocular Movements: Extraocular movements intact.     Conjunctiva/sclera: Conjunctivae normal.     Pupils: Pupils are equal, round, and reactive to light.  Cardiovascular:     Rate and Rhythm: Normal rate and regular rhythm.     Pulses:  Normal pulses.     Heart sounds: Normal heart sounds. No murmur heard. Pulmonary:     Effort: Pulmonary effort is normal. No respiratory distress.     Breath sounds: Normal breath sounds.  Abdominal:     General: Abdomen is flat. There is no distension.     Palpations: Abdomen is soft.     Tenderness: There is no abdominal tenderness. There is no guarding.  Musculoskeletal:        General: Normal range of motion.     Cervical back: Normal range of motion and neck supple.     Right lower leg: No edema.     Left lower leg: No edema.  Skin:    General: Skin is warm and dry.  Neurological:     General: No focal deficit present.     Mental Status: He is alert and oriented to person, place, and time.     Cranial Nerves: No cranial nerve deficit.     Sensory: No sensory deficit.     Motor: No weakness.     Gait: Gait normal.  Psychiatric:        Mood and Affect: Mood normal.        Behavior: Behavior normal.     Assessment and Plan  Encounter for medical examination to establish care Assessment & Plan: Previously screened HIV was negative. Will discussed Hep C, lipid screening, and vaccinations at next visit.  Declined Flu vaccine.   Sleep attack Assessment & Plan: Episodes described by patient are most consistent with sleep attacks. Syncope and seizures cannot be ruled out however patient does not have prodromal symptoms or post-ictal symptoms. Neuro exam reassuring, and VSS. Suspect possible narcolepsy. Given frequency and chronicity, will refer to neurology. Patient given instructions to not drive or operate heavy machinery as it is a safety risk. Plan to follow-up after Neurology visit.  Orders: -     Ambulatory referral to Neurology  POTS (postural orthostatic tachycardia syndrome) Assessment & Plan: Patient unsure of his home dose of midodrine. Unclear whether patient is taking 22m or 118mTID. Instructed to bring in medication at next visit.    Return in about 1 month  (around 09/15/2022).  [name], PGY-1 [facility] 8:27 PM 08/15/2022

## 2022-08-15 NOTE — Assessment & Plan Note (Addendum)
Previously screened HIV was negative. Will discussed Hep C, lipid screening, and vaccinations at next visit. Declined Flu vaccine.

## 2022-08-15 NOTE — Assessment & Plan Note (Signed)
Patient unsure of his home dose of midodrine. Unclear whether patient is taking 5mg  or 10mg  TID. Instructed to bring in medication at next visit.

## 2022-09-14 ENCOUNTER — Encounter: Payer: Self-pay | Admitting: *Deleted

## 2022-09-14 ENCOUNTER — Encounter: Payer: Self-pay | Admitting: Neurology

## 2022-09-14 ENCOUNTER — Ambulatory Visit (INDEPENDENT_AMBULATORY_CARE_PROVIDER_SITE_OTHER): Payer: BC Managed Care – PPO | Admitting: Neurology

## 2022-09-14 VITALS — BP 115/71 | HR 103 | Ht 72.0 in | Wt 157.0 lb

## 2022-09-14 DIAGNOSIS — G4710 Hypersomnia, unspecified: Secondary | ICD-10-CM | POA: Diagnosis not present

## 2022-09-14 DIAGNOSIS — G47419 Narcolepsy without cataplexy: Secondary | ICD-10-CM

## 2022-09-14 DIAGNOSIS — R400 Somnolence: Secondary | ICD-10-CM | POA: Diagnosis not present

## 2022-09-14 NOTE — Progress Notes (Signed)
Subjective:    Patient ID: Robert Crane is a 22 y.o. male.  Migraine       Star Age, MD, PhD [facility] [address] [place], [place] [address]  Dear Drs. Richardine Service,   I saw your patient, Robert Crane, upon your kind request in my sleep clinic today for initial consultation of his sleep disorder, in particular, his excessive daytime somnolence.  The patient is unaccompanied today.  As you know, Robert Crane is a 22 year old male with an underlying medical history of POTS, asthma, and tricuspid valve insufficiency, who reports an approximately 2-year history of uncontrolled sleepiness during the day.  He reports that he has fallen asleep standing up and while talking to someone, even at the stove.  He has fallen asleep at the stoplight but has not had a car accident and denies falling asleep while driving.  His Epworth sleepiness score is 20 out of 24, fatigue severity score is 26 out of 63.  He is single and lives with his girlfriend who has not mentioned any apneas or snoring to him.  He has no children, they have 2 dogs in the household.  He has fallen asleep letting the dogs out.  He has no family history of sleepiness.  He denies any telltale symptoms of cataplexy, hypnopompic or hypnagogic hallucinations or sleep paralysis episodes.  He avoids caffeine because of his heart condition he says.  He has tried drinking up to 2 cups of coffee but he is still sleepy.  He tried taking naps but wakes up sleepy and it is hard for him to wake up first thing in the morning.  He has an occasional nocturnal headaches and typically does not take any medicines for headaches.  Bedtime can vary between 11 PM and 1 AM when he is working and rise time is around 6 AM when he works.  On his days off he may sleep till 10 AM, as late as 2 PM.  He drinks alcohol occasionally, he is a non-smoker of cigarettes, he denies smoking any marijuana and does not  drink caffeine daily.  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 I reviewed your office note from 08/15/2022.  A few years ago he tried melatonin at night for sleep as he was having trouble falling asleep, he reports that he slept too much and felt too sleepy from it.  He denies dreaming and naps, he denies vivid dreams.  He feels that he rarely dreams.  His Past Medical History Is Significant For: Past Medical History:  Diagnosis Date   Asthma    POTS (postural orthostatic tachycardia syndrome)    Tricuspid valvular disorder     His Past Surgical History Is Significant For: History reviewed. No pertinent surgical history.  His Family History Is Significant For: Family History  Problem Relation Age of Onset   Healthy Mother    Sleep apnea Neg Hx    Narcolepsy Neg Hx     His Social History Is Significant For: Social History   Socioeconomic History   Marital status: Married    Spouse name: Not on file   Number of children: Not on file   Years of education: Not on file   Highest education level: Not on file  Occupational History   Not on file  Tobacco Use   Smoking status: Never   Smokeless tobacco: Never  Vaping Use   Vaping Use: Some days  Substance and  Sexual Activity   Alcohol use: Yes    Comment: occ   Drug use: No   Sexual activity: Not on file  Other Topics Concern   Not on file  Social History Narrative   Not on file   Social Determinants of Health   Financial Resource Strain: Not on file  Food Insecurity: Not on file  Transportation Needs: Not on file  Physical Activity: Not on file  Stress: Not on file  Social Connections: Not on file    His Allergies Are:  No Known Allergies:   His Current Medications Are:  Outpatient Encounter Medications as of 09/14/2022  Medication Sig   albuterol (VENTOLIN HFA) 108 (90 Base) MCG/ACT inhaler Inhale 2 puffs into the lungs every 4 (four) hours as needed for wheezing or shortness of breath.    midodrine (PROAMATINE) 5 MG tablet Take 5 mg by mouth 3 (three) times daily with meals.   Multiple Vitamin (MULTIVITAMIN) tablet Take 1 tablet by mouth daily.   NON FORMULARY Anti inflam   ondansetron (ZOFRAN-ODT) 4 MG disintegrating tablet Take 1 tablet (4 mg total) by mouth every 8 (eight) hours as needed.   PROPRANOLOL HCL PO Take by mouth.   No facility-administered encounter medications on file as of 09/14/2022.  :   Review of Systems:  Out of a complete 14 point review of systems, all are reviewed and negative with the exception of these symptoms as listed below:   Review of Systems  Neurological:        Pt here for sleep consult Pt has little headaches , fatigue , falls asleep walking the dog,cooking,and sleep in car while car is parked Pt denies hypertension,snoring,sleep study,CPAP machine     ESS:21 FSS    Objective:  Neurological Exam  Physical Exam Physical Examination:   Vitals:   09/14/22 1301  BP: 115/71  Pulse: (!) 103    General Examination: The patient is a very pleasant 22 y.o. male in no acute distress. He appears well-developed and well-nourished and adequately groomed, wearing workloads and work boots.   HEENT: Normocephalic, atraumatic, pupils are equal, round and reactive to light, extraocular tracking is good without limitation to gaze excursion or nystagmus noted. Hearing is grossly intact. Face is symmetric with normal facial animation. Speech is clear with no dysarthria noted.  Speech is scant and he is very soft-spoken.  Neck with full range of motion.  No carotid bruits. Oropharynx exam reveals: mild mouth dryness, adequate dental hygiene and significant airway crowding, Mallampati class I, uvula on the smaller side, tonsils 1+ bilaterally.  Tongue protrudes centrally and palate elevates symmetrically.  Neck circumference 14 1/8 inches.  Minimal overbite noted.   Chest: Clear to auscultation without wheezing, rhonchi or crackles noted.  Heart:  S1+S2+0, regular and normal without murmurs, rubs or gallops noted.   Abdomen: Soft, non-tender and non-distended.  Extremities: There is no pitting edema in the distal lower extremities bilaterally.   Skin: Warm and dry without trophic changes noted.   Musculoskeletal: exam reveals no obvious joint deformities.   Neurologically:  Mental status: The patient is awake, alert and oriented in all 4 spheres. His immediate and remote memory, attention, language skills and fund of knowledge are appropriate. There is no evidence of aphasia, agnosia, apraxia or anomia. Speech is clear with normal prosody and enunciation. Thought process is linear. Mood is normal and affect is normal.  Cranial nerves II - XII are as described above under HEENT exam.  Motor exam: Normal  bulk, strength and tone is noted. There is no obvious action or resting tremor.  Fine motor skills and coordination: grossly intact.  Cerebellar testing: No dysmetria or intention tremor. There is no truncal or gait ataxia.  Sensory exam: intact to light touch in the upper and lower extremities.  Gait, station and balance: He stands easily. No veering to one side is noted. No leaning to one side is noted. Posture is age-appropriate and stance is narrow based. Gait shows normal stride length and normal pace. No problems turning are noted.   Assessment and Plan:  In summary, Robert Crane is a very pleasant 22 y.o.-year old male with an underlying medical history of POTS, asthma, and tricuspid valve insufficiency, who presents for evaluation of his hypersomnolence of approximately 2 years duration.  Of note, he reports that some years ago he tried melatonin because he had trouble falling asleep and he was too sleepy and sedated from it.  History and examination are not suggestive of underlying sleep disordered breathing but differential diagnosis includes hypersomnolence disorder such as idiopathic hypersomnolence versus narcolepsy without  cataplexy, sleep deprivation, sleep disordered breathing. While he may have an underlying hypersomnolence disorder, with narcolepsy without cataplexy or idiopathic hypersomnolence in the main differential diagnoses and/or hypersomnolence with sleep apnea, also being a possibility, he probably also a component of perpetual sleep deprivation. The patient is advised to proceed with extended sleep study testing in the form of nocturnal polysomnogram, followed by a next day nap study, called MSLT (mean sleep latency test). I explained the sleep study procedures to the patient. In preparation for sleep study testing he is advised to keep a steady sleep schedule, allowing for 7 to 8 hours of sleep on any given night. Should the overnight PSG reveal obstructive sleep apnea we will forego the next day nap study, as we will have to treat the underlying sleep disordered breathing first.  We may consider symptomatic medications after testing.  He was advised that he is at risk of falling asleep while driving and is reminded to avoid driving when feeling sleepy, avoid late night or very early morning driving and long distance driving.  He is advised to avoid using any heavy machinery.  We will plan a follow-up after testing.  I answered all his questions today and he was in agreement with our plan.   Thank you very much for allowing me to participate in the care of this nice patient. If I can be of any further assistance to you please do not hesitate to call me at [phone].  Sincerely,   Star Age, MD, PhD

## 2022-09-14 NOTE — Patient Instructions (Signed)
Thank you for choosing [facility] for your sleep related care! It was nice to meet you today! I appreciate that you entrust me with your sleep related healthcare concerns. I hope, I was able to address at least some of your concerns today, and that I can help you feel reassured and also get better.    Here is what we discussed today and what we came up with as our plan for you:    Based on your symptoms and your exam I believe you may have an underlying sleepiness condition. We can look into your sleepiness and tiredness, which you had for years with a nighttime sleep study, followed by a daytime nap study.  We may consider medication to help your condition after your testing.  Please keep a set schedule for your sleep at night, we recommend 7 to 8 hours of sleep on any given night.  The overnight sleep study will help determine whether you do or do not have OSA and how severe it is. Even, if you have mild OSA, I may want you to consider treatment with CPAP, as treatment of even borderline or mild sleep apnea can result and improvement of symptoms such as sleep disruption, daytime sleepiness, nighttime bathroom breaks, restless leg symptoms, improvement of headache syndromes, even improved mood disorder.   Please remember, the long-term risks and ramifications of untreated moderate to severe obstructive sleep apnea are: increased Cardiovascular disease, including congestive heart failure, stroke, difficult to control hypertension, treatment resistant obesity, arrhythmias, especially irregular heartbeat commonly known as A. Fib. (atrial fibrillation); even type 2 diabetes has been linked to untreated OSA.   Sleep apnea can cause disruption of sleep and sleep deprivation in most cases, which, in turn, can cause recurrent headaches, problems with memory, mood, concentration, focus, and vigilance. Most people with untreated sleep apnea report excessive daytime sleepiness, which can  affect their ability to drive. Please do not drive if you feel sleepy. Patients with sleep apnea can also develop difficulty initiating and maintaining sleep (aka insomnia).   Having sleep apnea may increase your risk for other sleep disorders, including involuntary behaviors sleep such as sleep terrors, sleep talking, sleepwalking.    Having sleep apnea can also increase your risk for restless leg syndrome and leg movements at night.   Please note that untreated obstructive sleep apnea may carry additional perioperative morbidity. Patients with significant obstructive sleep apnea (typically, in the moderate to severe degree) should receive, if possible, perioperative PAP (positive airway pressure) therapy and the surgeons and particularly the anesthesiologists should be informed of the diagnosis and the severity of the sleep disordered breathing.   Please do not drive when feeling sleepy, do not use any heavy machinery.   I will plan to see you back after your sleep study to go over the test results and where to go from there. We will call you after your sleep study to advise about the results (most likely, you will hear from my nurse) and to set up an appointment at the time, as necessary.

## 2022-09-16 ENCOUNTER — Ambulatory Visit: Payer: Self-pay | Admitting: Family Medicine

## 2022-09-26 ENCOUNTER — Telehealth: Payer: Self-pay | Admitting: Neurology

## 2022-09-26 NOTE — Telephone Encounter (Signed)
Robert Crane: [id number] (exp. 09/26/22 to 11/24/22)  [employer] pending faxed notes

## 2022-09-27 NOTE — Telephone Encounter (Signed)
Patient left a voicemail on my phone yesterday 09/26/22.  I called the patient back today and left him a voicemail to call back to schedule his SS.  [employer] Josem Kaufmann: [id number] (exp. 09/26/22 to 11/24/22)   [employer] no Josem Kaufmann req via fax form

## 2022-10-05 ENCOUNTER — Ambulatory Visit
Admission: EM | Admit: 2022-10-05 | Discharge: 2022-10-05 | Disposition: A | Discharge status: Home / Self Care | Payer: BC Managed Care – PPO | Attending: Family Medicine | Admitting: Family Medicine

## 2022-10-05 DIAGNOSIS — R197 Diarrhea, unspecified: Secondary | ICD-10-CM

## 2022-10-05 MED ORDER — ALBUTEROL SULFATE HFA 108 (90 BASE) MCG/ACT IN AERS: 1.0000 | INHALATION_SPRAY | Freq: Four times a day (QID) | RESPIRATORY_TRACT | 1 refills | Status: AC | PRN

## 2022-10-05 MED ORDER — ONDANSETRON 4 MG PO TBDP
4.0000 mg | ORAL_TABLET | Freq: Once | ORAL | Status: AC
  Administered 2022-10-05: 4 mg via ORAL

## 2022-10-05 MED ORDER — ONDANSETRON 4 MG PO TBDP: 4.0000 mg | ORAL_TABLET | Freq: Three times a day (TID) | ORAL | 0 refills | Status: DC | PRN

## 2022-10-05 NOTE — ED Triage Notes (Signed)
Pt c/o nausea, vomiting, abd pain, diarrhea,   Onset ~ last night  

## 2022-10-06 NOTE — Progress Notes (Signed)
    SUBJECTIVE:   CHIEF COMPLAINT / HPI: follow-up  Sleep Attacks - Per chart review patient has not scheduled sleep study.  Patient states that he has now scheduled it. Follow by sleep medicine. Patient state he has not had sleep attacks while doing activities during day. He is keeping driving to a minimum. Does still fall asleep during movies. Trying improve sleep hygiene.  ED yesterday - Diarrhea, states he got Zofran. Thinks it was food related. Keeping hydrated.   PERTINENT  PMH / PSH: POTS, Sleep attacks  OBJECTIVE:   BP 118/62   Pulse 80   Ht 6' (1.829 m)   Wt 151 lb (68.5 kg)   BMI 20.48 kg/m   General: NAD, sitting comfortably in clinic Respiratory: normal WOB on RA Extremities: Moving all 4 extremities equally   ASSESSMENT/PLAN:   Sleep attack Assessment & Plan: Seen by Dr. Birdie Sons with concern for idiopathic hypersomnolence versus narcolepsy without cataplexy, sleep deprivation, sleep disordered breathing with also additional component of perpetual sleep deprivation.  -Continue sleep study in May -Continue improved sleep hygiene -Minimize driving   POTS (postural orthostatic tachycardia syndrome) Assessment & Plan: Patient stated he would reach out to his cardiologist to follow-up. Instructed that I would refill his midodrine if needed one time to bridge to appointment but does not need one now.     Return in about 3 months (around 01/07/2023).  Precepted with Dr. Wendy Poet  Salvadore Oxford, [place]

## 2022-10-07 ENCOUNTER — Encounter: Payer: Self-pay | Admitting: *Deleted

## 2022-10-07 ENCOUNTER — Ambulatory Visit (INDEPENDENT_AMBULATORY_CARE_PROVIDER_SITE_OTHER): Payer: BC Managed Care – PPO | Admitting: Family Medicine

## 2022-10-07 ENCOUNTER — Encounter: Payer: Self-pay | Admitting: Family Medicine

## 2022-10-07 VITALS — BP 118/62 | HR 80 | Ht 72.0 in | Wt 151.0 lb

## 2022-10-07 DIAGNOSIS — G90A Postural orthostatic tachycardia syndrome (POTS): Secondary | ICD-10-CM | POA: Diagnosis not present

## 2022-10-07 DIAGNOSIS — G47419 Narcolepsy without cataplexy: Secondary | ICD-10-CM | POA: Diagnosis not present

## 2022-10-07 DIAGNOSIS — K589 Irritable bowel syndrome without diarrhea: Secondary | ICD-10-CM | POA: Insufficient documentation

## 2022-10-07 NOTE — Assessment & Plan Note (Signed)
Patient stated he would reach out to his cardiologist to follow-up. Instructed that I would refill his midodrine if needed one time to bridge to appointment but does not need one now.

## 2022-10-07 NOTE — Assessment & Plan Note (Signed)
Seen by Dr. Birdie Sons with concern for idiopathic hypersomnolence versus narcolepsy without cataplexy, sleep deprivation, sleep disordered breathing with also additional component of perpetual sleep deprivation.  -Continue sleep study in May -Continue improved sleep hygiene -Minimize driving

## 2022-10-07 NOTE — Patient Instructions (Signed)
It was great to see you! Thank you for allowing me to participate in your care!  I recommend that you always bring your medications to each appointment as this makes it easy to ensure we are on the correct medications and helps [place] not miss when refills are needed.  Our plans for today:  -3 months to see how your sleep problems are doing. -Please reach out to your cardiologist to follow-up on your POTS   Please arrive 15 minutes PRIOR to your next scheduled appointment time! If you do not, this affects OTHER patients' care.  Take care and seek immediate care sooner if you develop any concerns.   Dr. Salvadore Oxford, MD [place]

## 2022-10-08 NOTE — ED Provider Notes (Signed)
[facility]   [id number] 10/05/22 Arrival Time: 1903  ASSESSMENT & PLAN:  1. Diarrhea, unspecified type    No signs of dehydration. Meds ordered this encounter  Medications   ondansetron (ZOFRAN-ODT) disintegrating tablet 4 mg   ondansetron (ZOFRAN-ODT) 4 MG disintegrating tablet    Sig: Take 1 tablet (4 mg total) by mouth every 8 (eight) hours as needed for nausea or vomiting.    Dispense:  15 tablet    Refill:  0   albuterol (VENTOLIN HFA) 108 (90 Base) MCG/ACT inhaler    Sig: Inhale 1-2 puffs into the lungs every 6 (six) hours as needed for wheezing or shortness of breath.    Dispense:  1 each    Refill:  1   Alb MDI refilled at his request. No current asthma symptoms.  Discussed typical duration of symptoms for suspected viral GI illness. Will do his best to ensure adequate fluid intake in order to avoid dehydration. Will proceed to the Emergency Department for evaluation if unable to tolerate PO fluids regularly.  Otherwise he will f/u with his PCP or here if not showing improvement over the next 48-72 hours.  Reviewed expectations re: course of current medical issues. Questions answered. Outlined signs and symptoms indicating need for more acute intervention. Patient verbalized understanding. After Visit Summary given.   SUBJECTIVE: History from: patient.  Robert Crane is a 22 y.o. male who presents with complaint of non-bilious, non-bloody  intermittent n/v with non-bloody diarrhea. Onset  yest evening . Abdominal discomfort: mild and cramping. Symptoms are gradually improving since beginning. Aggravating factors: eating. Alleviating factors: none identified. Associated symptoms: fatigue. He denies fever. Appetite: decreased. PO intake: decreased. Ambulatory without assistance. Urinary symptoms: none. Sick contacts: none. Recent travel or camping: none. OTC treatment: none.   History reviewed. No pertinent surgical history.  OBJECTIVE:  Vitals:    10/05/22 1921  BP: 128/76  Pulse: 99  Resp: 16  Temp: 98 F (36.7 C)  TempSrc: Oral  SpO2: 98%    General appearance: alert; no distress Oropharynx: moist Lungs: clear to auscultation bilaterally; unlabored Heart: regular rate and rhythm Abdomen: soft; non-distended; no significant abdominal tenderness; bowel sounds present; no masses or organomegaly; no guarding or rebound tenderness Back: no CVA tenderness Extremities: no edema; symmetrical with no gross deformities Skin: warm; dry Neurologic: normal gait Psychological: alert and cooperative; normal mood and affect  Labs:  Labs Reviewed - No data to display  Imaging: No results found.  No Known Allergies                                             Past Medical History:  Diagnosis Date   Asthma    POTS (postural orthostatic tachycardia syndrome)    Tricuspid valvular disorder    Social History   Socioeconomic History   Marital status: Married    Spouse name: Not on file   Number of children: Not on file   Years of education: Not on file   Highest education level: Not on file  Occupational History   Not on file  Tobacco Use   Smoking status: Never   Smokeless tobacco: Never  Vaping Use   Vaping Use: Some days  Substance and Sexual Activity   Alcohol use: Yes    Comment: occ   Drug use: No   Sexual activity: Not on file  Other Topics Concern   Not on file  Social History Narrative   Not on file   Social Determinants of Health   Financial Resource Strain: Not on file  Food Insecurity: Not on file  Transportation Needs: Not on file  Physical Activity: Not on file  Stress: Not on file  Social Connections: Not on file  Intimate Partner Violence: Not on file   Family History  Problem Relation Age of Onset   Healthy Mother    Sleep apnea Neg Hx    Narcolepsy Neg Hx       Vanessa Kick, MD 10/08/22 [phone]

## 2022-10-17 NOTE — Telephone Encounter (Signed)
Updated BC[employer] auth:  NPSG/MSLT [employer] Josem Kaufmann: [id number] (exp. 10/17/22 to 12/15/22)

## 2022-12-01 NOTE — Telephone Encounter (Signed)
Patient canceled his 11/29/22 & 11/30/22 appt. He is r/s for 02/13/23 & 02/14/23 I will redo his auth's when the appt gets closer.

## 2022-12-05 NOTE — Telephone Encounter (Signed)
Mail new packet to the patient.

## 2022-12-23 ENCOUNTER — Ambulatory Visit: Payer: Self-pay | Admitting: Family Medicine

## 2023-01-11 ENCOUNTER — Telehealth: Payer: Self-pay | Admitting: Neurology

## 2023-01-11 NOTE — Telephone Encounter (Signed)
I called the patient and left him a voicemail asking him if he has any new insurance because the BCBS we had on file termed on 12/16/22. His MCD [employer] is still active. I gave my direct number to call me back if he has a new insurance.

## 2023-01-11 NOTE — Telephone Encounter (Signed)
Pt called to verify that he lost his [employer] as of 12/16/22. He only has MCD right now. His new insurance will not kick in for 90 days.

## 2023-01-12 NOTE — Telephone Encounter (Signed)
Noted, thank you. NPSG/MSLT- MCD [employer] no auth req via fax form   The patient is scheduled for 02/13/23 & 02/14/23.

## 2023-01-17 ENCOUNTER — Ambulatory Visit
Admission: EM | Admit: 2023-01-17 | Discharge: 2023-01-17 | Disposition: A | Discharge status: Home / Self Care | Payer: Medicaid Other

## 2023-01-17 DIAGNOSIS — K580 Irritable bowel syndrome with diarrhea: Secondary | ICD-10-CM

## 2023-01-17 DIAGNOSIS — G901 Familial dysautonomia [Riley-Day]: Secondary | ICD-10-CM | POA: Insufficient documentation

## 2023-01-17 MED ORDER — ONDANSETRON 8 MG PO TBDP: 8.0000 mg | ORAL_TABLET | Freq: Three times a day (TID) | ORAL | 0 refills | Status: AC | PRN

## 2023-01-17 MED ORDER — LOPERAMIDE HCL 2 MG PO CAPS: 4.0000 mg | ORAL_CAPSULE | Freq: Four times a day (QID) | ORAL | 0 refills | Status: AC | PRN

## 2023-01-17 MED ORDER — DICYCLOMINE HCL 20 MG PO TABS: 20.0000 mg | ORAL_TABLET | Freq: Two times a day (BID) | ORAL | 0 refills | Status: DC | PRN

## 2023-01-17 NOTE — ED Triage Notes (Signed)
Here for "Abdominal pain with diarrhea starting last night" with some diarrhea/loose stools "now". "I have a heart condition so if am trying to nip this in the bud in case it leads to dehydration". Some nausea with vomiting "1x, last night". No fever. Last void "this morning around 10-11 am". Last stool "around 10-11 am".

## 2023-01-17 NOTE — ED Provider Notes (Signed)
[facility]    CSN: [id number] Arrival date & time: 01/17/23  1917      History   Chief Complaint Chief Complaint  Patient presents with   Abdominal Pain    With Nausea/Vomiting (1 Episode)    HPI Robert Crane is a 22 y.o. male.   HPI Patient with a recent history of recurrent abdominal pain, constipation presents today with generalized abdominal pain and diarrhea which started overnight.  He endorses nausea and vomiting which started last night he did have vomiting. No vomiting today, although endorses epigastric abdominal cramping and frequent loose stools. Patient has a history of IBS and endorses symptoms are frequently present. He is able to eat and drink, however after oral intake, he is experiencing diarrhea. He denies any other symptoms. Past Medical History:  Diagnosis Date   Asthma    POTS (postural orthostatic tachycardia syndrome)    Tricuspid valvular disorder     Patient Active Problem List   Diagnosis Date Noted   Dysautonomia (HCC) 01/17/2023   Sleep attack 08/15/2022   Encounter for medical examination to establish care 08/15/2022   Irritable bowel syndrome with constipation and diarrhea 08/28/2017   Tricuspid valve insufficiency 04/22/2015   Postural orthostatic tachycardia syndrome 03/05/2013   Syncope 12/26/2012   Asthma without status asthmaticus 02/23/2003    History reviewed. No pertinent surgical history.     Home Medications    Prior to Admission medications   Medication Sig Start Date End Date Taking? Authorizing Provider  dicyclomine (BENTYL) 20 MG tablet Take 1 tablet (20 mg total) by mouth 2 (two) times daily as needed for spasms (abdominal cramps). 01/17/23  Yes Bing Neighbors, NP  loperamide (IMODIUM) 2 MG capsule Take 2 capsules (4 mg total) by mouth every 6 (six) hours as needed for diarrhea or loose stools. 01/17/23  Yes Bing Neighbors, NP  midodrine (PROAMATINE) 5 MG tablet Take 5 mg by mouth 3 (three) times  daily with meals.   Yes [provider]  midodrine (PROAMATINE) 5 MG tablet Take by mouth. 03/13/21  Yes [provider]  ondansetron (ZOFRAN-ODT) 8 MG disintegrating tablet Take 1 tablet (8 mg total) by mouth every 8 (eight) hours as needed for up to 5 days for nausea. 01/17/23 01/22/23 Yes Bing Neighbors, NP  albuterol (PROVENTIL) (2.5 MG/3ML) 0.083% nebulizer solution Inhale into the lungs.    [provider]  albuterol (VENTOLIN HFA) 108 (90 Base) MCG/ACT inhaler Inhale 1-2 puffs into the lungs every 6 (six) hours as needed for wheezing or shortness of breath. 10/05/22   Mardella Layman, MD  albuterol (VENTOLIN HFA) 108 (90 Base) MCG/ACT inhaler Inhale into the lungs.    [provider]  amitriptyline (ELAVIL) 10 MG tablet Take by mouth. 12/19/17   [provider]  fludrocortisone (FLORINEF) 0.1 MG tablet Take by mouth. 07/28/15   [provider]  fluticasone (FLOVENT HFA) 220 MCG/ACT inhaler INHALE 2 PUFFS INTO THE LUNGS TWICE A DAY 11/28/17   [provider]  ibuprofen (ADVIL) 600 MG tablet Take by mouth. 07/15/17   [provider]  meclizine (ANTIVERT) 12.5 MG tablet Take by mouth.    [provider]  meclizine (ANTIVERT) 25 MG tablet Take by mouth.    [provider]  Melatonin 5 MG CAPS Take by mouth.    [provider]  midodrine (PROAMATINE) 2.5 MG tablet Take by mouth.    [provider]  Multiple Vitamin (MULTIVITAMIN PO) Take by mouth.  [provider]  Multiple Vitamin (MULTIVITAMIN) tablet Take 1 tablet by mouth daily.    [provider]  NON FORMULARY Anti inflam    [provider]  propranolol (INDERAL) 10 MG tablet Take 1 tablet by mouth 2 (two) times daily. 03/16/21   [provider]  PROPRANOLOL HCL PO Take by mouth.    [provider]    Family History Family History  Problem Relation Age of Onset   Healthy Mother    Sleep  apnea Neg Hx    Narcolepsy Neg Hx     Social History Social History   Tobacco Use   Smoking status: Never   Smokeless tobacco: Never  Vaping Use   Vaping Use: Some days   Substances: Nicotine, Flavoring  Substance Use Topics   Alcohol use: Yes    Comment: occ   Drug use: No     Allergies   Patient has no known allergies.   Review of Systems Review of Systems Pertinent negatives listed in HPI  Physical Exam Triage Vital Signs ED Triage Vitals  Enc Vitals Group     BP 01/17/23 1928 121/78     Pulse Rate 01/17/23 1928 64     Resp 01/17/23 1928 16     Temp 01/17/23 1928 98.1 F (36.7 C)     Temp Source 01/17/23 1928 Oral     SpO2 01/17/23 1928 99 %     Weight 01/17/23 1925 154 lb (69.9 kg)     Height 01/17/23 1925 6' (1.829 m)     Head Circumference --      Peak Flow --      Pain Score 01/17/23 1924 4     Pain Loc --      Pain Edu? --      Excl. in GC? --    No data found.  Updated Vital Signs BP 121/78 (BP Location: Left Arm)   Pulse 64   Temp 98.1 F (36.7 C) (Oral)   Resp 16   Ht 6' (1.829 m)   Wt 154 lb (69.9 kg)   SpO2 99%   BMI 20.89 kg/m   Visual Acuity Right Eye Distance:   Left Eye Distance:   Bilateral Distance:    Right Eye Near:   Left Eye Near:    Bilateral Near:     Physical Exam Vitals and nursing note reviewed.  Constitutional:      Appearance: He is well-developed.  Eyes:     Extraocular Movements: Extraocular movements intact.     Pupils: Pupils are equal, round, and reactive to light.  Cardiovascular:     Rate and Rhythm: Normal rate and regular rhythm.  Pulmonary:     Effort: Pulmonary effort is normal.     Breath sounds: Normal breath sounds.  Abdominal:     General: Abdomen is flat. Bowel sounds are increased. There is no distension.     Tenderness: There is abdominal tenderness in the epigastric area. There is no right CVA tenderness, left CVA tenderness, guarding or rebound.  Skin:    General: Skin is warm and  dry.  Neurological:     Mental Status: He is alert.     UC Treatments / Results  Labs (all labs ordered are listed, but only abnormal results are displayed) Labs Reviewed - No data to display  EKG   Radiology No results found.  Procedures Procedures (including critical care time)  Medications Ordered in UC Medications - No data to display  Initial Impression / Assessment and Plan / UC Course  I have reviewed the triage vital signs and the nursing notes.  Pertinent labs & imaging results that were available during my care of the patient were reviewed by me and considered in my medical decision making (see chart for details).    IBS, exacerbation. Treatment of symptoms per discharge medication orders. ED precautions given if symptoms become severe. Patient verbalize understanding and agreement with plan.  Final Clinical Impressions(s) / UC Diagnoses   Final diagnoses:  Irritable bowel syndrome with diarrhea   Discharge Instructions   None    ED Prescriptions     Medication Sig Dispense Auth. Provider   ondansetron (ZOFRAN-ODT) 8 MG disintegrating tablet Take 1 tablet (8 mg total) by mouth every 8 (eight) hours as needed for up to 5 days for nausea. 15 tablet Bing Neighbors, NP   loperamide (IMODIUM) 2 MG capsule Take 2 capsules (4 mg total) by mouth every 6 (six) hours as needed for diarrhea or loose stools. 20 capsule Bing Neighbors, NP   dicyclomine (BENTYL) 20 MG tablet Take 1 tablet (20 mg total) by mouth 2 (two) times daily as needed for spasms (abdominal cramps). 20 tablet Bing Neighbors, NP      PDMP not reviewed this encounter.   Bing Neighbors, NP 01/21/23 [phone]

## 2023-02-13 NOTE — Telephone Encounter (Signed)
Patient called and cancel his NPSG/MSLT today due to his wisdom teeth is coming in and he is having a lot of pain and is not sleep well at all due to that.. he is seeing the dentist but he needed to r/s his SS.  He is r/s for Tuesday 05/09/2023 at 8 pm and all day on 05/10/2023.  I mailed a new packet to the patient also put on there to taper off Amitriptyline, Melatonin & Propranolol 2 weeks prior to the study.

## 2023-02-21 ENCOUNTER — Ambulatory Visit: Payer: BC Managed Care – PPO | Admitting: Family Medicine

## 2023-02-21 NOTE — Progress Notes (Deleted)
    SUBJECTIVE:   CHIEF COMPLAINT / HPI: f/u  ***  PERTINENT  PMH / PSH: POTS, Hypersomnolence disorder   OBJECTIVE:   There were no vitals taken for this visit.  ***  ASSESSMENT/PLAN:   There are no diagnoses linked to this encounter. No follow-ups on file.  Celine Mans, MD [facility]

## 2023-05-09 ENCOUNTER — Ambulatory Visit (INDEPENDENT_AMBULATORY_CARE_PROVIDER_SITE_OTHER): Payer: Medicaid Other | Admitting: Neurology

## 2023-05-09 DIAGNOSIS — R400 Somnolence: Secondary | ICD-10-CM

## 2023-05-09 DIAGNOSIS — G47419 Narcolepsy without cataplexy: Secondary | ICD-10-CM

## 2023-05-09 DIAGNOSIS — G4711 Idiopathic hypersomnia with long sleep time: Secondary | ICD-10-CM

## 2023-05-09 DIAGNOSIS — G4710 Hypersomnia, unspecified: Secondary | ICD-10-CM

## 2023-05-10 ENCOUNTER — Other Ambulatory Visit: Payer: Self-pay

## 2023-05-10 ENCOUNTER — Other Ambulatory Visit: Payer: Self-pay | Admitting: *Deleted

## 2023-05-10 ENCOUNTER — Ambulatory Visit: Payer: Medicaid Other

## 2023-05-10 DIAGNOSIS — Z79899 Other long term (current) drug therapy: Secondary | ICD-10-CM

## 2023-05-10 DIAGNOSIS — G4710 Hypersomnia, unspecified: Secondary | ICD-10-CM

## 2023-05-10 DIAGNOSIS — G47419 Narcolepsy without cataplexy: Secondary | ICD-10-CM

## 2023-05-10 DIAGNOSIS — R400 Somnolence: Secondary | ICD-10-CM

## 2023-05-13 LAB — COMPREHENSIVE DRUG ANALYSIS,UR

## 2023-05-15 ENCOUNTER — Telehealth: Payer: Self-pay | Admitting: *Deleted

## 2023-05-15 NOTE — Telephone Encounter (Signed)
Called pt & LVM with office number asking for call back.  

## 2023-05-15 NOTE — Telephone Encounter (Signed)
-----   Message from Huston Foley sent at 05/15/2023  7:39 AM EDT ----- Please call patient regarding his urine drug screen that we did for his daytime nap test.  Please advise him that there was carboxy-THC on the urine drug screen which is a metabolite of THC.  Smoking marijuana or using THC products can affect his daytime sleepiness and the results of his sleep studies, please ask him when he last used any and how frequently he uses any THC products.

## 2023-05-17 NOTE — Procedures (Signed)
Physician Interpretation:     [facility] POLYSOMNOGRAPHY  INTERPRETATION REPORT   STUDY DATE:  05/09/2023     PATIENT NAME:  Robert Crane         DATE OF BIRTH:  March 18, 2001  PATIENT ID:  [id number]    TYPE OF STUDY:  PSG  READING PHYSICIAN: Huston Foley, MD, PhD   SCORING TECHNICIAN: Margaretann Loveless, RPSGT     Referred by: Celine Mans, MD  ? History and Indication for Testing: 22 year old male with an underlying medical history of POTS, asthma, and tricuspid valve insufficiency, who reports an approximately 2-year history of uncontrolled sleepiness during the day. He reports that he has fallen asleep standing up and while talking to someone, even at the stove. He has fallen asleep at the stoplight but has not had a car accident and denies falling asleep while driving. His Epworth sleepiness score is 20 out of 24, fatigue severity score is 26 out of 63. He presents for a nighttime sleep study with a next day nap study, MSLT (multiple sleep latency test). A UDS from 05/10/23 was positive for carboxy-THC, which is a metabolite of THC (tetrahydrocannabinol). Sources of THC are most commonly herbal marijuana or marijuana-based products. THC can be present in a scheduled prescription medications. Trace amounts of THC can be present in hemp and cannabidiol (CBD) products. (Of note, the test does not distinguish between delta-9-tetrahydrocannabinol, the predominant form of THC in most herbal or marijuana-based products, and delta-8-tetrahydrocannabinol.) Of note, the patient reported that he used a THC product on or around 04/26/23. Height: 72 in Weight: 157 lb (BMI 21) Neck Size: 14 in    MEDICATIONS: Ventolin, Proamatine, Multivitamin, Zofran, Propranolol   TECHNICAL DESCRIPTION: A registered sleep technologist was in attendance for the duration of the recording.  Data collection, scoring, video monitoring, and reporting were performed in compliance with the AASM  Manual for the Scoring of Sleep and Associated Events; (Hypopnea is scored based on the criteria listed in Section VIII D. 1b in the AASM Manual V2.6 using a 4% oxygen desaturation rule or Hypopnea is scored based on the criteria listed in Section VIII D. 1a in the AASM Manual V2.6 using 3% oxygen desaturation and /or arousal rule).   SLEEP CONTINUITY AND SLEEP ARCHITECTURE:  Lights-out was at 22:20: and lights-on at  06:06:, with a total recording time of 7 hours, 46. min. Total sleep time ( TST) was 415.0 minutes with a normal sleep efficiency at 89.1%. There was  21.7% REM sleep.   BODY POSITION:  TST was divided  between the following sleep positions: 68.4% supine;  31.6% lateral;  0% prone. Duration of total sleep and percent of total sleep in their respective position is as follows: supine 284 minutes (68%), non-supine 131 minutes (32%); right 131 minutes (32%), left 00 minutes (0%), and prone 00 minutes (0%).  Total supine REM sleep time was 68 minutes (76% of total REM sleep).  Sleep latency was increased at 32.0 minutes.  REM sleep latency was slightly below normal at 60.5 minutes. Of the total sleep time, the percentage of stage N1 sleep was 1.7%, stage N2 sleep was 52%, which is normal, stage N3 sleep was 24.9%, which is mildly increased, and REM sleep was 21.7%, which is normal. Wake after sleep onset (WASO) time accounted for 19 minutes with minimal intermittent sleep fragmentation noted.    RESPIRATORY MONITORING:   Based on CMS criteria (using a 4% oxygen desaturation rule for scoring hypopneas), there  were 0 apneas (0 obstructive; 0 central; 0 mixed), and 1 hypopneas.  Apnea index was 0.0. Hypopnea index was 0.1. The apnea-hypopnea index was 0.1 overall (0.2 supine, 0 non-supine; 0.0 REM, 0.0 supine REM).  There were 0 respiratory effort-related arousals (RERAs).  The RERA index was 0 events/h. Total respiratory disturbance index (RDI) was 0.1 events/h. RDI results showed: supine RDI  0.2  /h; non-supine RDI 0.0 /h; REM RDI 0.0 /h, supine REM RDI 0.0 /h.   Based on AASM criteria (using a 3% oxygen desaturation and /or arousal rule for scoring hypopneas), there were 0 apneas (0 obstructive; 0 central; 0 mixed), and 1 hypopneas. Apnea index was 0.0. Hypopnea index was 0.1. The apnea-hypopnea index was 0.1/hour overall (0.2 supine, 0 non-supine; 0.0 REM, 0.0 supine REM).  There were 0 respiratory effort-related arousals (RERAs).  The RERA index was 0 events/h. Total respiratory disturbance index (RDI) was 0.1 events/h. RDI results showed: supine RDI  0.2 /h; non-supine RDI 0.0 /h; REM RDI 0.0 /h, supine REM RDI 0.0 /h.    OXIMETRY: Oxyhemoglobin Saturation Nadir during sleep was at  92% from a mean of 97%.  Of the Total sleep time (TST)   hypoxemia (=<88%) was present for  0.1 minutes, or 0.0% of total sleep time.    LIMB MOVEMENTS: There were 0 periodic limb movements of sleep (0.0/hr), of which 0 (0.0/hr) were associated with an arousal.   AROUSAL: There were 30 arousals in total, for an arousal index of 4 arousals/hour.  Of these, 0 were identified as respiratory-related arousals (0 /h), 0 were PLM-related arousals (0 /h), and 43 were non-specific arousals (6 /h).   EEG: Review of the EEG showed no abnormal electrical discharges and symmetrical bihemispheric findings.    EKG: The EKG revealed normal sinus rhythm (NSR). The average heart rate during sleep was 62 bpm.  AUDIO/VIDEO REVIEW: The audio and video review did not show any abnormal or unusual behaviors, movements, phonations or vocalizations. The patient took no restroom breaks. No audible snoring was noted.  POST-STUDY QUESTIONNAIRE: Post study, the patient indicated, that sleep was the same as usual.  The patient had a nap study, MSLT, next day on 05/10/23 with a mean sleep latency of 4.1 minutes for 5 naps and 1 SOREMP (sleep onset REM period) noted, namely during nap # 2.   IMPRESSION:  1. Idiopathic  Hypersomnolence  RECOMMENDATIONS:  1. The overnight sleep study and next day nap study indicate no significantly sleep disordered breathing with a low AHI of 0.1/hour and O2 nadir of 92%. No significant snoring was noted. The low mean sleep latency (4.1 min) and only 1 SOREMP (sleep onset REM period) during the nap study indicate severe sleepiness, but do not fulfill criteria for narcolepsy. These sleep study findings, along with the patients clinical history support the diagnosis of idiopathic hypersomnolence. Treatment options should be discussed with the patient. Carboxy-THC was found in the UDS from 05/10/23.  Carboxy-THC is a metabolite of tetrahydrocannabinol (THC). Sources of THC are most commonly herbal marijuana or marijuana-based products.  Trace amounts of THC can be found in health and, but trace amounts of THC can be found in hemp and cannabidiol (CBD) products. THC is also present in a scheduled prescription medication. The use of THC in drug-naive patients can increase sleepiness and in chronic users it may cause difficulty sleeping. In the greater clinical context, I don't believe the validity of the sleep studies is affected, however, THC use can affect sleep and  sleepiness. While THC typically may be sedating right before bedtime, in some patients, particularly in the drug-nave patient it can promote wakefulness and delay in sleep onset. The patient reported using a THC product several days before the studies and no habitual use. The overall clinical relevance of this is not fully clear. The patient will be discouraged from further [facility] product use.    2. The patient should be cautioned not to drive, work at heights, or operate dangerous or heavy equipment when tired or sleepy. Review and reiteration of good sleep hygiene measures should be pursued with any patient. 3. This study shows no significant sleep fragmentation and fairly normal sleep stage percentages; these are nonspecific findings  and per se do not signify an intrinsic sleep disorder or a cause for the patient's sleep-related symptoms. Causes include (but are not limited to) the first night effect of the sleep study, circadian rhythm disturbances, medication effect or an underlying mood disorder or medical problem.  4. The patient will be seen in follow-up in the sleep clinic at [facility] for discussion of the test results, symptom and treatment compliance review, further management strategies, etc. The referring provider will be notified of the test results.   I certify that I have reviewed the entire raw data recording prior to the issuance of this report in accordance with the Standards of Accreditation of the American Academy of Sleep Medicine (AASM).  Huston Foley, MD, PhD Medical Director, [facility]) Diplomat, ABPN (Neurology and Sleep)               Technical Report:   General Information  Name: Robert Crane, Robert Crane BMI: 21.29 Physician: Huston Foley, MD  ID: [id number] Height: 72.0 in Technician: Margaretann Loveless, RPSGT  Sex: Male Weight: 157.0 lb Record: [id number]  Age: 89 [2001/03/17] Date: 05/09/2023    Medical & Medication History    22 year old male with an underlying medical history of POTS, asthma, and tricuspid valve insufficiency, who reports an approximately 2-year history of uncontrolled sleepiness during the day. He reports that he has fallen asleep standing up and while talking to someone, even at the stove. He has fallen asleep at the stoplight but has not had a car accident and denies falling asleep while driving. He denies any telltale symptoms of cataplexy, hypnopompic or hypnagogic hallucinations or sleep paralysis episodes. A few years ago he tried melatonin at night for sleep as he was having trouble falling asleep, he reports that he slept too much and felt too sleepy from it. Ventolin, Proamatine, Multivitamin, Zofran, Propranolol   Sleep Disorder       Comments   The patient came into the lab for a PSG/MSLT. No medications for the patient to wean off. The patient had no restroom breaks. EKG showed no obvious cardiac arrhythmias. No snoring. All sleep stages witnessed. Slept lateral and supine. No respiratory events with a 4% desat observed. AHI was 0 after 2 hrs of TST.     Lights out: 10:20:58 PM Lights on: 06:06:59 AM   Time Total Supine Side Prone Upright  Recording (TRT) 7h 46.57m 5h 29.47m 2h 16.75m 0h 0.30m 0h 0.93m  Sleep (TST) 6h 55.74m 4h 44.40m 2h 11.18m 0h 0.62m 0h 0.64m   Latency N1 N2 N3 REM Onset Per. Slp. Eff.  Actual 0h 0.55m 0h 1.6m 0h 14.32m 1h 0.5m 0h 32.14m 0h 32.24m 89.06%   Stg Dur Wake N1 N2 N3 REM  Total 51.0 7.0 214.5 103.5 90.0  Supine 45.5 4.5  146.5 65.0 68.0  Side 5.5 2.5 68.0 38.5 22.0  Prone 0.0 0.0 0.0 0.0 0.0  Upright 0.0 0.0 0.0 0.0 0.0   Stg % Wake N1 N2 N3 REM  Total 10.9 1.7 51.7 24.9 21.7  Supine 9.8 1.1 35.3 15.7 16.4  Side 1.2 0.6 16.4 9.3 5.3  Prone 0.0 0.0 0.0 0.0 0.0  Upright 0.0 0.0 0.0 0.0 0.0     Apnea Summary Sub Supine Side Prone Upright  Total 0 Total 0 0 0 0 0    REM 0 0 0 0 0    NREM 0 0 0 0 0  Obs 0 REM 0 0 0 0 0    NREM 0 0 0 0 0  Mix 0 REM 0 0 0 0 0    NREM 0 0 0 0 0  Cen 0 REM 0 0 0 0 0    NREM 0 0 0 0 0   Rera Summary Sub Supine Side Prone Upright  Total 0 Total 0 0 0 0 0    REM 0 0 0 0 0    NREM 0 0 0 0 0   Hypopnea Summary Sub Supine Side Prone Upright  Total 1 Total 1 1 0 0 0    REM 0 0 0 0 0    NREM 1 1 0 0 0   4% Hypopnea Summary Sub Supine Side Prone Upright  Total (4%) 1 Total 1 1 0 0 0    REM 0 0 0 0 0    NREM 1 1 0 0 0     AHI Total Obs Mix Cen  0.14 Apnea 0.00 0.00 0.00 0.00   Hypopnea 0.14 -- -- --  0.14 Hypopnea (4%) 0.14 -- -- --    Total Supine Side Prone Upright  Position AHI 0.14 0.21 0.00 0.00 0.00  REM AHI 0.00   NREM AHI 0.18   Position RDI 0.14 0.21 0.00 0.00 0.00  REM RDI 0.00   NREM RDI 0.18    4% Hypopnea Total Supine Side Prone  Upright  Position AHI (4%) 0.14 0.21 0.00 0.00 0.00  REM AHI (4%) 0.00   NREM AHI (4%) 0.18   Position RDI (4%) 0.14 0.21 0.00 0.00 0.00  REM RDI (4%) 0.00   NREM RDI (4%) 0.18    Desaturation Information Threshold: 2% <100% <90% <80% <70% <60% <50% <40%  Supine 34.0 0.0 0.0 0.0 0.0 0.0 0.0  Side 18.0 0.0 0.0 0.0 0.0 0.0 0.0  Prone 0.0 0.0 0.0 0.0 0.0 0.0 0.0  Upright 0.0 0.0 0.0 0.0 0.0 0.0 0.0  Total 52.0 0.0 0.0 0.0 0.0 0.0 0.0  Index 7.2 0.0 0.0 0.0 0.0 0.0 0.0   Threshold: 3% <100% <90% <80% <70% <60% <50% <40%  Supine 5.0 0.0 0.0 0.0 0.0 0.0 0.0  Side 3.0 0.0 0.0 0.0 0.0 0.0 0.0  Prone 0.0 0.0 0.0 0.0 0.0 0.0 0.0  Upright 0.0 0.0 0.0 0.0 0.0 0.0 0.0  Total 8.0 0.0 0.0 0.0 0.0 0.0 0.0  Index 1.1 0.0 0.0 0.0 0.0 0.0 0.0   Threshold: 4% <100% <90% <80% <70% <60% <50% <40%  Supine 1.0 0.0 0.0 0.0 0.0 0.0 0.0  Side 1.0 0.0 0.0 0.0 0.0 0.0 0.0  Prone 0.0 0.0 0.0 0.0 0.0 0.0 0.0  Upright 0.0 0.0 0.0 0.0 0.0 0.0 0.0  Total 2.0 0.0 0.0 0.0 0.0 0.0 0.0  Index 0.3 0.0 0.0 0.0 0.0 0.0 0.0   Threshold: 3% <100% <90% <  80% <70% <60% <50% <40%  Supine 5 0 0 0 0 0 0  Side 3 0 0 0 0 0 0  Prone 0 0 0 0 0 0 0  Upright 0 0 0 0 0 0 0  Total 8 0 0 0 0 0 0   Awakening/Arousal Information # of Awakenings 18  Wake after sleep onset 19.53m  Wake after persistent sleep 19.23m   Arousal Assoc. Arousals Index  Apneas 0 0.0  Hypopneas 0 0.0  Leg Movements 0 0.0  Snore 0 0.0  PTT Arousals 0 0.0  Spontaneous 43 6.2  Total 43 6.2  Leg Movement Information PLMS LMs Index  Total LMs during PLMS 0 0.0  LMs w/ Microarousals 0 0.0   LM LMs Index  w/ Microarousal 0 0.0  w/ Awakening 0 0.0  w/ Resp Event 0 0.0  Spontaneous 0 0.0  Total 0 0.0     Desaturation threshold setting: 3% Minimum desaturation setting: 10 seconds SaO2 nadir: 92% The longest event was a 22 sec obstructive Hypopnea with a minimum SaO2 of 95%. The lowest SaO2 was 95% associated with a 22 sec obstructive  Hypopnea. EKG Rates EKG Avg Max Min  Awake 63 90 49  Asleep 62 97 48  EKG Events: N/A   MSLT Physician Interpretation:   [facility] MSLT Report    Name: Robert Crane, Robert Crane UEA:[id number]  Study Date: 05/10/2023   DOB: 2000/10/24    Referred by: Celine Mans, MD  ? History and Indication for Testing: 22 year old male with an underlying medical history of POTS, asthma, and tricuspid valve insufficiency, who reports an approximately 2-year history of uncontrolled sleepiness during the day. He reports that he has fallen asleep standing up and while talking to someone, even at the stove. He has fallen asleep at the stoplight but has not had a car accident and denies falling asleep while driving. His Epworth sleepiness score is 20 out of 24, fatigue severity score is 26 out of 63. He presents for a nighttime sleep study with a next day nap study, MSLT (multiple sleep latency test). A UDS from 05/10/23 was positive for carboxy-THC, which is a metabolite of THC (tetrahydrocannabinol). Sources of THC are most commonly herbal marijuana or marijuana-based products. THC can be present in a scheduled prescription medications. Trace amounts of THC can be present in hemp and cannabidiol (CBD) products. (Of note, the test does not distinguish between delta-9-tetrahydrocannabinol, the predominant form of THC in most herbal or marijuana-based products, and delta-8-tetrahydrocannabinol.) Of note, the patient reported that he used a THC product on or around 04/26/23. Height: 72 in Weight: 157 lb (BMI 21) Neck Size: 14 in    MEDICATIONS: Ventolin, Proamatine, Multivitamin, Zofran, Propranolol Protocol This is a 13 channel Multiple Sleep Latency Test comprised of 5 channels of EEG (T3-Cz, Cz-T4, F4-M1, C4-M1, O2-M1). 3 channels of Chin EMG, 4 channels of EOG and 1 channel for ECG. All channels were sampled at 256 Hz.  This polysomnographic procedure is designed to evaluate (1) the  complaint of excessive daytime sleepiness by quantifying the time required to fall asleep and (2) the possibility of narcolepsy by checking for abnormally short latencies to REM sleep. Electrographic variables include EEG, EMG, EOG and ECG. Patients are monitored throughout four or five 20-minute opportunities to sleep (naps) at two-hour intervals. For each nap, the patient is allowed 20 minutes to fall asleep. Once asleep, the patient is awakened after 15 minutes. Between naps, the patient is kept  as alert as possible. A sleep latency of 20 minutes indicates that no sleep occurred. Parametric Analysis Total Number of Naps 5   NAP # Time of Nap Sleep Latency (mins) REM Latency (mins) Sleep Time Percent Awake Time Percent   1 08:40 3.0 20.0 81 19    2 10:33 4.0 4.5 76 24    3 12:11 4.5 20.0 74 26    4 14:28 3.0 20.0 83 17    5 16:09 6.0 20.0 71 29     MSLT Summary of Naps  Sleepiness Index: 79.5  Mean Sleep Latency: 4.1  Number of Naps with REM Sleep: 1   Results from Preceding PSG Study  Sleep Onset Time 22:52 Sleep Efficiency (%) 89.06%  Rise Time 06:06 Sleep Latency (min) 32 min  Total Sleep Time 6h REM Latency (min) 1hr 0.18min   Name: Robert Crane, Robert Crane ZOX:[id number]  Study Date: 05/10/2023   DOB: 02-01-2001   This MSLT shows a mean sleep latency of 4.1 minutes for 5 naps and 1 SOREMP (sleep onset REM period) noted, namely during nap # 2.   IMPRESSION:  1. Idiopathic Hypersomnolence  RECOMMENDATIONS:  1. The overnight sleep study and next day nap study indicate no significantly sleep disordered breathing with a low AHI of 0.1/hour and O2 nadir of 92%. No significant snoring was noted. The low mean sleep latency (4.1 min) and only 1 SOREMP (sleep onset REM period) during the nap study indicate severe sleepiness, but do not fulfill criteria for narcolepsy. These sleep study findings, along with the patients clinical history support the diagnosis of idiopathic hypersomnolence.  Treatment options should be discussed with the patient. Carboxy-THC was found in the UDS from 05/10/23.  Carboxy-THC is a metabolite of tetrahydrocannabinol (THC). Sources of THC are most commonly herbal marijuana or marijuana-based products.  Trace amounts of THC can be found in health and, but trace amounts of THC can be found in hemp and cannabidiol (CBD) products. THC is also present in a scheduled prescription medication. The use of THC in drug-naive patients can increase sleepiness and in chronic users it may cause difficulty sleeping. In the greater clinical context, I don't believe the validity of the sleep studies is affected, however, THC use can affect sleep and sleepiness. While THC typically may be sedating right before bedtime, in some patients, particularly in the drug-nave patient it can promote wakefulness and delay in sleep onset. The patient reported using a THC product several days before the studies and no habitual use. The overall clinical relevance of this is not fully clear. The patient will be discouraged from further [facility] product use.    2. The patient should be cautioned not to drive, work at heights, or operate dangerous or heavy equipment when tired or sleepy. Review and reiteration of good sleep hygiene measures should be pursued with any patient. 3. This study shows no significant sleep fragmentation and fairly normal sleep stage percentages; these are nonspecific findings and per se do not signify an intrinsic sleep disorder or a cause for the patient's sleep-related symptoms. Causes include (but are not limited to) the first night effect of the sleep study, circadian rhythm disturbances, medication effect or an underlying mood disorder or medical problem.  4. The patient will be seen in follow-up in the sleep clinic at [facility] for discussion of the test results, symptom and treatment compliance review, further management strategies, etc. The referring provider will be notified of the  test results.   I certify that I  have reviewed the entire raw data recording prior to the issuance of this report in accordance with the Standards of Accreditation of the American Academy of Sleep Medicine (AASM).  Huston Foley, MD, PhD Medical Director, [facility]) Diplomat, ABPN (Neurology and Sleep)   MSLT Technical Report:   General Information  Name: Robert Crane, Robert Crane BMI: 21 Physician: Huston Foley, MD  ID: [id number] Height: 72 in Technician: Marianne Sofia  Sex: Male Weight: 157 lb Record: [id number]  Age: 48 [[birth year]] Date: 05/10/2023 Scorer: Irene Pap, RPSGT   Nap 1 Nap Start: 08:39:59 AM Nap End: 08:58:00 AM    Latency to Sleep Onset: 3.65m Total Sleep Time: 14.25m Stg Dur REM N1 N2 N3   0.47m 2.59m 12.12m 0.20m    Nap 2 Nap Start: 10:33:24 AM Nap End: 10:52:19 AM    Latency to Sleep Onset: 4.30m Total Sleep Time: 14.5m Stg Dur REM N1 N2 N3   10.60m 2.36m 2.55m 0.31m    Nap 3 Nap Start: 12:11:42 PM Nap End: 12:30:58 PM    Latency to Sleep Onset: 4.19m Total Sleep Time: 14.58m Stg Dur REM N1 N2 N3   0.80m 2.74m 12.91m 0.61m    Nap 4 Nap Start: 02:28:21 PM Nap End: 02:46:02 PM    Latency to Sleep Onset: 3.77m Total Sleep Time: 14.1m Stg Dur REM N1 N2 N3   0.18m 1.45m 13.64m 0.15m    Nap 5 Nap Start: 04:09:21 PM Nap End: 04:30:03 PM    Latency to Sleep Onset: 6.55m Total Sleep Time: 15.32m Stg Dur REM N1 N2 N3   0.56m 1.27m 13.55m 0.55m    Mean Sleep Latency: 4.24m Number of Sleep Onset REM Periods: 1

## 2023-05-17 NOTE — Telephone Encounter (Signed)
I called pt again and a male answered. She said she recently got this number. Therefore, this is no longer the pt's phone number. I called his mother Carollee Herter (on [place]) and LVM with our office number asking for call back with pt's contact info or have him call us to update his contact info as we are trying to reach him.

## 2023-05-17 NOTE — Telephone Encounter (Signed)
I called pt. I relayed the message.   He stated that he had some family/ stress/ wisdom teeth out.  Took a smoking product / vaporizer he believes 04-26-2023.  He thought he had given it enough time before the drug screen.

## 2023-05-17 NOTE — Telephone Encounter (Signed)
Patient left voicemail on our phone with a call back. He stated he got a new number. His number is [phone] I updated his chart.

## 2023-05-17 NOTE — Telephone Encounter (Signed)
Noted, thank you for reaching out to patient.

## 2023-05-17 NOTE — Procedures (Signed)
See PSG Report.

## 2023-05-18 ENCOUNTER — Telehealth: Payer: Self-pay | Admitting: *Deleted

## 2023-05-18 NOTE — Telephone Encounter (Signed)
LVM for patient asking for call back to discuss results.

## 2023-05-18 NOTE — Telephone Encounter (Signed)
Pt returned my call. We discussed his sleep study results. The patient verbalized understanding and we scheduled his f/u appt for 08/07/23 at 7:45 AM and placed him on the wait list. His questions were answered.

## 2023-05-18 NOTE — Telephone Encounter (Signed)
-----   Message from Huston Foley sent at 05/17/2023  5:55 PM EDT ----- Patient referred by PCP, seen by me on 09/14/22 for sleepiness, diagnostic PSG on 05/09/23 with next day MSLT on 05/10/23.   Please call and notify the patient that the recent sleep studies showed no significant obstructive sleep apnea. He is indeed very sleepy and may have a condition called idiopathic hypersomnolence. I recommend we go over test result and potential treatment options during a FU visit. Please arrange FU appt.  Remind patient not to use any THC products and not to drive when feeling sleepy.   Huston Foley, MD, PhD [facility])

## 2023-08-07 ENCOUNTER — Encounter: Payer: Self-pay | Admitting: Neurology

## 2023-08-07 ENCOUNTER — Ambulatory Visit (INDEPENDENT_AMBULATORY_CARE_PROVIDER_SITE_OTHER): Payer: Medicaid Other | Admitting: Neurology

## 2023-08-07 VITALS — BP 134/80 | HR 71 | Ht 72.0 in | Wt 205.8 lb

## 2023-08-07 DIAGNOSIS — G4711 Idiopathic hypersomnia with long sleep time: Secondary | ICD-10-CM

## 2023-08-07 MED ORDER — MODAFINIL 100 MG PO TABS
100.0000 mg | ORAL_TABLET | Freq: Two times a day (BID) | ORAL | 3 refills | Status: DC
Start: 2023-08-07 — End: 2023-11-28

## 2023-08-07 NOTE — Progress Notes (Signed)
Subjective:    Patient ID: Robert Crane is a 23 y.o. male.  HPI    Interim history:   Robert Crane is a 23 year old male with an underlying medical history of ADD or ADHD, POTS, asthma, and tricuspid valve insufficiency, who presents for follow-up consultation of his sleep disorder, in particular, his hypersomnolence, after interim testing.  The patient is unaccompanied today.  I first met him at the request of his primary care physician, at which time he reported a 2-year history of excessive daytime somnolence, including sleepiness at the wheel.  He was advised to proceed with extended sleep testing including a nighttime sleep study with next day nap study.  He had a baseline polysomnogram through our office on 05/09/2023 which showed no significant snoring or sleep disordered breathing with an AHI of 0.1/h, O2 nadir 92%.  He had a significant increase in sleepiness with a mean sleep latency of 4.1 minutes for 5 naps   with 1 sleep onset REM period achieved.  Of note, his UDS was positive for carboxy THC.  He was advised of the test results by phone call.  Today, 08/07/2023: He reports feeling about the same.  He can get through his morning.  Well, he does have a 30-minute commute but at work, there are times when he has to do desk work where he may doze off and someone has to tap him on his shoulder.  He has not seen cardiology in over 2 years.  He has a history of tricuspid insufficiency and POTS.  He does not have any new symptoms but would like to get checked out.  He had an episode of palpitation this weekend.  He used to be on Ritalin for either ADD or ADHD in the past as well as Strattera but currently is no longer on these medications.  He does not smoke marijuana regularly.  He reports that at the time of his marijuana use he was in severe pain from his wisdom teeth extractions.  He is feeling better now.   The patient's allergies, current medications, family history, past medical  history, past social history, past surgical history and problem list were reviewed and updated as appropriate.   Previously:    09/14/2022: (He) reports an approximately 2-year history of uncontrolled sleepiness during the day.  He reports that he has fallen asleep standing up and while talking to someone, even at the stove.  He has fallen asleep at the stoplight but has not had a car accident and denies falling asleep while driving.  His Epworth sleepiness score is 20 out of 24, fatigue severity score is 26 out of 63.  He is single and lives with his girlfriend who has not mentioned any apneas or snoring to him.  He has no children, they have 2 dogs in the household.  He has fallen asleep letting the dogs out.  He has no family history of sleepiness.  He denies any telltale symptoms of cataplexy, hypnopompic or hypnagogic hallucinations or sleep paralysis episodes.  He avoids caffeine because of his heart condition he says.  He has tried drinking up to 2 cups of coffee but he is still sleepy.  He tried taking naps but wakes up sleepy and it is hard for him to wake up first thing in the morning.  He has an occasional nocturnal headaches and typically does not take any medicines for headaches.  Bedtime can vary between 11 PM and 1 AM when he is working and rise  time is around 6 AM when he works.  On his days off he may sleep till 10 AM, as late as 2 PM.  He drinks alcohol occasionally, he is a non-smoker of cigarettes, he denies smoking any marijuana and does not drink caffeine daily.  He works as a Teaching laboratory technician, typically doing oil changes. I reviewed your office note from 08/15/2022.  A few years ago he tried melatonin at night for sleep as he was having trouble falling asleep, he reports that he slept too much and felt too sleepy from it.  He denies dreaming and naps, he denies vivid dreams.  He feels that he rarely dreams.  His Past Medical History Is Significant For: Past Medical History:  Diagnosis Date    Asthma    POTS (postural orthostatic tachycardia syndrome)    Tricuspid valvular disorder     His Past Surgical History Is Significant For: History reviewed. No pertinent surgical history.  His Family History Is Significant For: Family History  Problem Relation Age of Onset   Healthy Mother    Sleep apnea Neg Hx    Narcolepsy Neg Hx     His Social History Is Significant For: Social History   Socioeconomic History   Marital status: Married    Spouse name: Not on file   Number of children: Not on file   Years of education: Not on file   Highest education level: Not on file  Occupational History   Not on file  Tobacco Use   Smoking status: Never   Smokeless tobacco: Never  Vaping Use   Vaping status: Some Days   Substances: Nicotine, Flavoring  Substance and Sexual Activity   Alcohol use: Yes    Comment: occ   Drug use: No   Sexual activity: Not Currently  Other Topics Concern   Not on file  Social History Narrative   Not on file   Social Drivers of Health   Financial Resource Strain: Not on file  Food Insecurity: Not on file  Transportation Needs: Not on file  Physical Activity: Not on file  Stress: Not on file  Social Connections: Not on file    His Allergies Are:  No Known Allergies:   His Current Medications Are:  Outpatient Encounter Medications as of 08/07/2023  Medication Sig   albuterol (PROVENTIL) (2.5 MG/3ML) 0.083% nebulizer solution Inhale into the lungs.   albuterol (VENTOLIN HFA) 108 (90 Base) MCG/ACT inhaler Inhale 1-2 puffs into the lungs every 6 (six) hours as needed for wheezing or shortness of breath.   albuterol (VENTOLIN HFA) 108 (90 Base) MCG/ACT inhaler Inhale into the lungs.   amitriptyline (ELAVIL) 10 MG tablet Take by mouth.   dicyclomine (BENTYL) 20 MG tablet Take 1 tablet (20 mg total) by mouth 2 (two) times daily as needed for spasms (abdominal cramps).   fludrocortisone (FLORINEF) 0.1 MG tablet Take by mouth.   fluticasone  (FLOVENT HFA) 220 MCG/ACT inhaler INHALE 2 PUFFS INTO THE LUNGS TWICE A DAY   ibuprofen (ADVIL) 600 MG tablet Take by mouth.   loperamide (IMODIUM) 2 MG capsule Take 2 capsules (4 mg total) by mouth every 6 (six) hours as needed for diarrhea or loose stools.   meclizine (ANTIVERT) 12.5 MG tablet Take by mouth.   meclizine (ANTIVERT) 25 MG tablet Take by mouth.   Melatonin 5 MG CAPS Take by mouth.   midodrine (PROAMATINE) 2.5 MG tablet Take by mouth.   midodrine (PROAMATINE) 5 MG tablet Take 5 mg by mouth  3 (three) times daily with meals.   midodrine (PROAMATINE) 5 MG tablet Take by mouth.   Multiple Vitamin (MULTIVITAMIN PO) Take by mouth.   Multiple Vitamin (MULTIVITAMIN) tablet Take 1 tablet by mouth daily.   NON FORMULARY Anti inflam   propranolol (INDERAL) 10 MG tablet Take 1 tablet by mouth 2 (two) times daily.   PROPRANOLOL HCL PO Take by mouth.   No facility-administered encounter medications on file as of 08/07/2023.  :  Review of Systems:  Out of a complete 14 point review of systems, all are reviewed and negative with the exception of these symptoms as listed below:   Review of Systems  Neurological:        Going over sleep study results. ESS 16.    Objective:  Neurological Exam  Physical Exam Physical Examination:   Vitals:   08/07/23 0744  BP: 134/80  Pulse: 71    General Examination: The patient is a very pleasant 23 y.o. male in no acute distress. He appears well-developed and well-nourished and well groomed.   HEENT: Normocephalic, atraumatic, pupils are equal, round and reactive to light, extraocular tracking is good without limitation to gaze excursion or nystagmus noted. Hearing is grossly intact. Face is symmetric with normal facial animation. Speech is clear with no dysarthria noted.  Speech is soft-spoken.  Neck with full range of motion.  No carotid bruits. Oropharynx exam reveals: mild mouth dryness, adequate dental hygiene and no significant airway  crowding.  Tongue protrudes centrally and palate elevates symmetrically.      Chest: Clear to auscultation without wheezing, rhonchi or crackles noted.   Heart: S1+S2+0, regular and normal without murmurs, rubs or gallops noted.    Abdomen: Soft, non-tender and non-distended.   Extremities: There is no obvious swelling in the distal lower extremities bilaterally.    Skin: Warm and dry without trophic changes noted.    Musculoskeletal: exam reveals no obvious joint deformities.    Neurologically:  Mental status: The patient is awake, alert and oriented in all 4 spheres. His immediate and remote memory, attention, language skills and fund of knowledge are appropriate. There is no evidence of aphasia, agnosia, apraxia or anomia. Speech is clear with normal prosody and enunciation. Thought process is linear. Mood is normal and affect is normal.  Cranial nerves II - XII are as described above under HEENT exam.  Motor exam: Normal bulk, strength and tone is noted. There is no obvious action or resting tremor.  Fine motor skills and coordination: grossly intact.  Cerebellar testing: No dysmetria or intention tremor. There is no truncal or gait ataxia.  Sensory exam: intact to light touch in the upper and lower extremities.  Gait, station and balance: He stands easily. No veering to one side is noted. No leaning to one side is noted. Posture is age-appropriate and stance is narrow based. Gait shows normal stride length and normal pace. No problems turning are noted.    Assessment and Plan:  In summary, Robert Crane is a very pleasant 23 year old male with an underlying medical history of ADD/ADHD as a child, POTS, asthma, and tricuspid valve insufficiency, who presents for follow-up consultation of his sleep disorder, in particular, his hypersomnolence, after interim testing. He had a baseline polysomnogram through our office on 05/09/2023 which showed no significant snoring or sleep disordered  breathing with an AHI of 0.1/h, O2 nadir 92%.  Sleep latency was increased at 32 minutes, REM latency is slightly decreased at 60.5 minutes.  His REM  percentage was 21.7, which is normal.  His mean sleep latency during his MSLT on 05/10/2023 was 4.1 minutes for 5 naps with 1 sleep onset REM period achieved.  Of note, his UDS was positive for carboxy THC.  Given his history and polysomnographic findings, he fulfills criteria for idiopathic hypersomnolence.  I explained this condition to the patient and the distinction between [facility] and narcolepsy.  He was counseled on marijuana cessation and strongly advised not to drive when feeling sleepy.  He is advised to start a medication to help him stay more alert during the day.  We discussed different options including stimulant and nonstimulant medications and we mutually agreed to proceed with Provigil generic, 100 mg strength twice daily.  He is advised to make an appointment with his cardiologist.  He is advised to make a follow-up appointment with his primary care to make sure there is no other medical causes of his sleepiness, I recommend blood work through PCP to get checked out for iron deficiency, anemia, thyroid dysfunction, vitamin B12 and vitamin D deficiencies.  He is advised to follow-up in this clinic in about 3 months, sooner if needed.  I answered all his questions today and he was in agreement.  I spent 40 minutes in total face-to-face time and in reviewing records during pre-charting, more than 50% of which was spent in counseling and coordination of care, reviewing test results, reviewing medications and treatment regimen and/or in discussing or reviewing the diagnosis of IH, the prognosis and treatment options. Pertinent laboratory and imaging test results that were available during this visit with the patient were reviewed by me and considered in my medical decision making (see chart for details).

## 2023-08-07 NOTE — Patient Instructions (Signed)
It was nice to see you again today.  Here is what we discussed today:  1.  Avoid marijuana or any other illicit drugs, drink alcohol with caution. 2. Do not drive or use heavy machinery when feeling sleepy. 3.  For your daytime sleepiness, I recommend we start you on Provigil 100 mg: 1 pill up to 2 times a day as needed. Avoid after 3 PM. Side effects include (but are not limited to): high blood pressure, headache, nervousness, palpitations, GI upset, tremor.  4.  I think it is a good idea to get established again with cardiology. 5.  Follow-up with your primary care, I recommend you get checked out for anemia, iron deficiency, vitamin D deficiency, vitamin B12 deficiency, and thyroid dysfunction as these conditions can affect alertness and cause fatigue and frank sleepiness at times.

## 2023-10-24 ENCOUNTER — Telehealth: Payer: Self-pay

## 2023-10-24 DIAGNOSIS — G90A Postural orthostatic tachycardia syndrome (POTS): Secondary | ICD-10-CM

## 2023-10-24 DIAGNOSIS — G901 Familial dysautonomia [Riley-Day]: Secondary | ICD-10-CM

## 2023-10-24 NOTE — Telephone Encounter (Signed)
 Pt  calling to get a referral for a new Cardiologist. He has aged out of his previous cardiologist. Would like to go to the [place] office at [facility].  Thank you, Clemencia Course, CMA

## 2023-10-25 NOTE — Addendum Note (Signed)
 Addended by: Celine Mans on: 10/25/2023 10:03 AM   Modules accepted: Orders

## 2023-11-07 ENCOUNTER — Telehealth: Payer: Self-pay

## 2023-11-07 ENCOUNTER — Encounter: Payer: Self-pay | Admitting: Neurology

## 2023-11-07 ENCOUNTER — Ambulatory Visit: Payer: Medicaid Other | Admitting: Neurology

## 2023-11-07 NOTE — Telephone Encounter (Signed)
 Pt rescheduled with Sarah for 11/28/23 at 2:45pm

## 2023-11-07 NOTE — Telephone Encounter (Signed)
 Any NP provider is fine.

## 2023-11-07 NOTE — Telephone Encounter (Signed)
 Pt called to r/s missed appt, very apologetic - slept through alarms. Please call patient back to r/s

## 2023-11-28 ENCOUNTER — Ambulatory Visit: Admitting: Neurology

## 2023-11-28 ENCOUNTER — Telehealth: Payer: Self-pay | Admitting: Neurology

## 2023-11-28 ENCOUNTER — Encounter: Payer: Self-pay | Admitting: Neurology

## 2023-11-28 VITALS — BP 121/80 | HR 93 | Ht 73.0 in | Wt 197.0 lb

## 2023-11-28 DIAGNOSIS — G4711 Idiopathic hypersomnia with long sleep time: Secondary | ICD-10-CM | POA: Insufficient documentation

## 2023-11-28 MED ORDER — MODAFINIL 100 MG PO TABS: ORAL_TABLET | ORAL | 3 refills | Status: DC

## 2023-11-28 NOTE — Progress Notes (Signed)
 Patient: Robert Crane Date of Birth: 05-Jun-2001  Reason for Visit: Follow up History from: Patient Primary Neurologist: Omar Bibber  ASSESSMENT AND PLAN 23 y.o. year old male   1.  Idiopathic hypersomnolence (He had a baseline polysomnogram through our office on 05/09/2023 which showed no significant snoring or sleep disordered breathing with an AHI of 0.1/h, O2 nadir 92%.  Sleep latency was increased at 32 minutes, REM latency is slightly decreased at 60.5 minutes.  His REM percentage was 21.7, which is normal.  His mean sleep latency during his MSLT on 05/10/2023 was 4.1 minutes for 5 naps with 1 sleep onset REM period achieved.  Of note, his UDS was positive for carboxy THC.  Given his history and polysomnographic findings, he fulfills criteria for idiopathic hypersomnolence)  -Very good benefit with Provigil , but still experiencing some breakthrough symptoms on 100 mg twice daily -Will increase to 200 mg AM/100 mg midday, not to take after 3 PM -Follow-up with cardiology for dysautonomia - Follow-up in 4 to 6 months with Dr. Omar Bibber.  Was originally scheduled to see her a couple weeks ago but missed appointment  Meds ordered this encounter  Medications   modafinil  (PROVIGIL ) 100 MG tablet    Sig: Take 2 pills in the morning, take 1 pill midday, do not take after 3 PM    Dispense:  90 tablet    Refill:  3    This is most up to date dosing, please cancel any other rx on file   HISTORY OF PRESENT ILLNESS: Today 11/28/23 Saw Dr. Omar Bibber in January 2025, started Provigil  100 mg twice a day for hypersomnolence.  Referred to cardiology for dysautonomia, was seeing peds cardiology waiting to establish with adult. Works as Pensions consultant at [employer], just got an offer at new [employer]. Is taking Provigil  100 mg AM/100 mg midday. Can definitely tell a difference, takes 30 minutes to kick in. Yesterday he felt a little sleepy before midday dose, it worked great. Doing job well,  hypersomnolence is much better. Still does experience breakthrough symptoms. He had to miss a few days due to cost, he felt much sleepier. So tired last night, he fell asleep before setting his alarm, was late for work. Still drinks coffee. When he eats he gets drowsy. Mostly computer makes him sleepy, he has to stand. ESS 16. Provigil  has improved symptoms 75%.  He had an appointment with Dr. Omar Bibber but missed it a few weeks ago. Sometimes may feel a little jittery after taking Provigil , but could be due to heart medication.   HISTORY  Mr. Robert Crane is a 23 year old male with an underlying medical history of ADD or ADHD, POTS, asthma, and tricuspid valve insufficiency, who presents for follow-up consultation of his sleep disorder, in particular, his hypersomnolence, after interim testing.  The patient is unaccompanied today.  I first met him at the request of his primary care physician, at which time he reported a 2-year history of excessive daytime somnolence, including sleepiness at the wheel.  He was advised to proceed with extended sleep testing including a nighttime sleep study with next day nap study.  He had a baseline polysomnogram through our office on 05/09/2023 which showed no significant snoring or sleep disordered breathing with an AHI of 0.1/h, O2 nadir 92%.  He had a significant increase in sleepiness with a mean sleep latency of 4.1 minutes for 5 naps   with 1 sleep onset REM period achieved.  Of note, his UDS was positive  for carboxy THC.  He was advised of the test results by phone call.   Today, 08/07/2023: He reports feeling about the same.  He can get through his morning.  Well, he does have a 30-minute commute but at work, there are times when he has to do desk work where he may doze off and someone has to tap him on his shoulder.  He has not seen cardiology in over 2 years.  He has a history of tricuspid insufficiency and POTS.  He does not have any new symptoms but would like to get  checked out.  He had an episode of palpitation this weekend.  He used to be on Ritalin for either ADD or ADHD in the past as well as Strattera but currently is no longer on these medications.  He does not smoke marijuana regularly.  He reports that at the time of his marijuana use he was in severe pain from his wisdom teeth extractions.  He is feeling better now.  REVIEW OF SYSTEMS: Out of a complete 14 system review of symptoms, the patient complains only of the following symptoms, and all other reviewed systems are negative.  See HPI  ALLERGIES: No Known Allergies  HOME MEDICATIONS: Outpatient Medications Prior to Visit  Medication Sig Dispense Refill   albuterol  (VENTOLIN  HFA) 108 (90 Base) MCG/ACT inhaler Inhale 1-2 puffs into the lungs every 6 (six) hours as needed for wheezing or shortness of breath. 1 each 1   loperamide  (IMODIUM ) 2 MG capsule Take 2 capsules (4 mg total) by mouth every 6 (six) hours as needed for diarrhea or loose stools. 20 capsule 0   Melatonin 5 MG CAPS Take by mouth.     Multiple Vitamin (MULTIVITAMIN) tablet Take 1 tablet by mouth daily.     propranolol (INDERAL) 10 MG tablet Take 1 tablet by mouth 2 (two) times daily.     modafinil  (PROVIGIL ) 100 MG tablet Take 1 tablet (100 mg total) by mouth 2 (two) times daily. Take 1 pill in the morning, second pill around lunchtime or early afternoon, avoid after 3 PM. 60 tablet 3   albuterol  (PROVENTIL ) (2.5 MG/3ML) 0.083% nebulizer solution Inhale into the lungs.     albuterol  (VENTOLIN  HFA) 108 (90 Base) MCG/ACT inhaler Inhale into the lungs.     amitriptyline (ELAVIL) 10 MG tablet Take by mouth.     dicyclomine  (BENTYL ) 20 MG tablet Take 1 tablet (20 mg total) by mouth 2 (two) times daily as needed for spasms (abdominal cramps). 20 tablet 0   fludrocortisone (FLORINEF) 0.1 MG tablet Take by mouth.     fluticasone  (FLOVENT  HFA) 220 MCG/ACT inhaler INHALE 2 PUFFS INTO THE LUNGS TWICE A DAY     ibuprofen  (ADVIL ) 600 MG  tablet Take by mouth.     meclizine (ANTIVERT) 12.5 MG tablet Take by mouth.     meclizine (ANTIVERT) 25 MG tablet Take by mouth.     midodrine (PROAMATINE) 2.5 MG tablet Take by mouth.     midodrine (PROAMATINE) 5 MG tablet Take 5 mg by mouth 3 (three) times daily with meals.     midodrine (PROAMATINE) 5 MG tablet Take by mouth.     Multiple Vitamin (MULTIVITAMIN PO) Take by mouth.     NON FORMULARY Anti inflam     PROPRANOLOL HCL PO Take by mouth.     No facility-administered medications prior to visit.    PAST MEDICAL HISTORY: Past Medical History:  Diagnosis Date   Asthma  POTS (postural orthostatic tachycardia syndrome)    Tricuspid valvular disorder     PAST SURGICAL HISTORY: History reviewed. No pertinent surgical history.  FAMILY HISTORY: Family History  Problem Relation Age of Onset   Healthy Mother    Sleep apnea Neg Hx    Narcolepsy Neg Hx     SOCIAL HISTORY: Social History   Socioeconomic History   Marital status: Married    Spouse name: Not on file   Number of children: Not on file   Years of education: Not on file   Highest education level: Not on file  Occupational History   Not on file  Tobacco Use   Smoking status: Never   Smokeless tobacco: Never  Vaping Use   Vaping status: Some Days   Substances: Nicotine, Flavoring  Substance and Sexual Activity   Alcohol use: Yes    Comment: occ   Drug use: No   Sexual activity: Not Currently  Other Topics Concern   Not on file  Social History Narrative   Not on file   Social Drivers of Health   Financial Resource Strain: Not on file  Food Insecurity: Not on file  Transportation Needs: Not on file  Physical Activity: Not on file  Stress: Not on file  Social Connections: Not on file  Intimate Partner Violence: Not on file    PHYSICAL EXAM  Vitals:   11/28/23 1448  BP: 121/80  Pulse: 93  Weight: 197 lb (89.4 kg)  Height: 6\' 1"  (1.854 m)   Body mass index is 25.99  kg/m.  Generalized: Well developed, in no acute distress  Neurological examination  Mentation: Alert oriented to time, place, history taking. Follows all commands speech and language fluent Cranial nerve II-XII: Pupils were equal round reactive to light. Extraocular movements were full, visual field were full on confrontational test. Facial sensation and strength were normal. Head turning and shoulder shrug  were normal and symmetric. Motor: The motor testing reveals 5 over 5 strength of all 4 extremities. Good symmetric motor tone is noted throughout.  Sensory: Sensory testing is intact to soft touch on all 4 extremities. No evidence of extinction is noted.  Coordination: Cerebellar testing reveals good finger-nose-finger and heel-to-shin bilaterally.  Gait and station: Gait is normal.  Reflexes: Deep tendon reflexes are symmetric and normal bilaterally.   DIAGNOSTIC DATA (LABS, IMAGING, TESTING) - I reviewed patient records, labs, notes, testing and imaging myself where available.  Lab Results  Component Value Date   WBC 4.0 (L) 07/01/2015   HGB 13.4 07/01/2015   HCT 38.9 07/01/2015   MCV 84.6 07/01/2015   PLT 129 (L) 07/01/2015      Component Value Date/Time   NA 140 07/01/2015 1525   K 3.4 (L) 07/01/2015 1525   CL 104 07/01/2015 1525   CO2 27 07/01/2015 1525   GLUCOSE 88 07/01/2015 1525   BUN 10 07/01/2015 1525   CREATININE 0.82 07/01/2015 1525   CALCIUM 9.0 07/01/2015 1525   PROT 6.6 07/01/2015 1525   ALBUMIN 4.4 07/01/2015 1525   AST 20 07/01/2015 1525   ALT 13 (L) 07/01/2015 1525   ALKPHOS 70 (L) 07/01/2015 1525   BILITOT 0.7 07/01/2015 1525   GFRNONAA NOT CALCULATED 07/01/2015 1525   GFRAA NOT CALCULATED 07/01/2015 1525   No results found for: "CHOL", "HDL", "LDLCALC", "LDLDIRECT", "TRIG", "CHOLHDL" No results found for: "HGBA1C" No results found for: "VITAMINB12" No results found for: "TSH"  Jeanmarie Millet, AGNP-C, DNP 11/28/2023, 3:14 PM [facility]  [address] [place], [place] [address] [phone]

## 2023-11-28 NOTE — Telephone Encounter (Signed)
Patient called to verify appointment 

## 2023-11-28 NOTE — Progress Notes (Signed)
 I reviewed the above note and documentation by the Nurse Practitioner and agree with the history, exam, assessment and plan as outlined above. I was available for consultation. Debbra Fairy, MD, PhD [facility])

## 2023-11-28 NOTE — Patient Instructions (Signed)
 Increase Provigil  200 mg a.m., 100 mg midday, not to take after 3 PM.  Monitor symptoms.  Call for any problems.  Follow-up in 4 to 6 months with Dr. Omar Bibber.  Thanks!!

## 2023-12-13 ENCOUNTER — Telehealth: Payer: Self-pay

## 2023-12-13 ENCOUNTER — Other Ambulatory Visit (HOSPITAL_COMMUNITY): Payer: Self-pay

## 2023-12-13 NOTE — Telephone Encounter (Signed)
 Pharmacy Patient Advocate Encounter   Received notification from CoverMyMeds that prior authorization for Modafinil  100MG  tablets is required/requested.   Insurance verification completed.   The patient is insured through [facility] .   Per test claim: PA required; PA submitted to above mentioned insurance via CoverMyMeds Key/confirmation #/EOC [id number] Status is pending

## 2023-12-15 ENCOUNTER — Other Ambulatory Visit (HOSPITAL_COMMUNITY): Payer: Self-pay

## 2023-12-15 NOTE — Telephone Encounter (Signed)
   Medicaid believes the PT has another insurance-PT will need to provide us  the primary insurance information for pharmacy benefits, if PT states he only has medicaid he will need to contact his medicaid worker in order to get that corrected. There is nothing further I can do to assist with PA at this time.

## 2023-12-18 NOTE — Telephone Encounter (Signed)
 Called pt and was not able to LVM due to VM not coming on.

## 2023-12-27 ENCOUNTER — Ambulatory Visit: Attending: Cardiology | Admitting: Cardiology

## 2024-03-20 ENCOUNTER — Other Ambulatory Visit: Payer: Self-pay | Admitting: Neurology

## 2024-03-20 MED ORDER — MODAFINIL 100 MG PO TABS: ORAL_TABLET | ORAL | 3 refills | Status: DC

## 2024-03-20 NOTE — Telephone Encounter (Signed)
 Pt called to request medication refill.Pt states Pharmacy  called and stated Pt should follow up with MD  modafinil  (PROVIGIL ) 100 MG tablet   Pt medication is to be sent to   [id number] [address], [place] - [address] [address] (Ph: [phone])

## 2024-03-20 NOTE — Telephone Encounter (Signed)
 Requested Prescriptions   Pending Prescriptions Disp Refills   modafinil  (PROVIGIL ) 100 MG tablet 90 tablet 3    Sig: Take 2 pills in the morning, take 1 pill midday, do not take after 3 PM   Last 11/28/23 Next appt scheduled 06/03/24 Dispenses   Dispensed Days Supply Quantity Provider Pharmacy  MODAFINIL  100 MG TABLET 11/06/2023 30 60 each Athar, Saima, MD [facility] [phone] - [facility]...  MODAFINIL  100 MG TABLET 09/25/2023 30 60 each Athar, Saima, MD [facility] [phone] - [facility]...  MODAFINIL  100 MG TABLET 08/07/2023 30 60 each Athar, Saima, MD [facility] [phone] - [place]

## 2024-04-08 ENCOUNTER — Other Ambulatory Visit (HOSPITAL_COMMUNITY): Payer: Self-pay

## 2024-04-08 ENCOUNTER — Telehealth: Payer: Self-pay

## 2024-04-08 ENCOUNTER — Ambulatory Visit

## 2024-04-08 VITALS — BP 107/74 | HR 78 | Ht 73.0 in | Wt 199.1 lb

## 2024-04-08 DIAGNOSIS — G901 Familial dysautonomia [Riley-Day]: Secondary | ICD-10-CM | POA: Diagnosis not present

## 2024-04-08 DIAGNOSIS — I071 Rheumatic tricuspid insufficiency: Secondary | ICD-10-CM | POA: Diagnosis present

## 2024-04-08 NOTE — Telephone Encounter (Signed)
 Received a request to do PA per CMM however when I run test claim it still states that medicaid believes he has a Editor, commissioning. I sent message back earlier this year about it as well.

## 2024-04-08 NOTE — Patient Instructions (Signed)
 Medication Instructions:  Your physician recommends that you continue on your current medications as directed. Please refer to the Current Medication list given to you today.  *If you need a refill on your cardiac medications before your next appointment, please call your pharmacy*  Lab Work: NONE If you have labs (blood work) drawn today and your tests are completely normal, you will receive your results only by: MyChart Message (if you have MyChart) OR A paper copy in the mail If you have any lab test that is abnormal or we need to change your treatment, we will call you to review the results.  Testing/Procedures: Your physician has requested that you have an echocardiogram. Echocardiography is a painless test that uses sound waves to create images of your heart. It provides your doctor with information about the size and shape of your heart and how well your heart's chambers and valves are working. This procedure takes approximately one hour. There are no restrictions for this procedure. Please do NOT wear cologne, perfume, aftershave, or lotions (deodorant is allowed). Please arrive 15 minutes prior to your appointment time.  Please note: We ask at that you not bring children with you during ultrasound (echo/ vascular) testing. Due to room size and safety concerns, children are not allowed in the ultrasound rooms during exams. Our front office staff cannot provide observation of children in our lobby area while testing is being conducted. An adult accompanying a patient to their appointment will only be allowed in the ultrasound room at the discretion of the ultrasound technician under special circumstances. We apologize for any inconvenience.   Follow-Up: At [facility], you and your health needs are our priority.  As part of our continuing mission to provide you with exceptional heart care, our providers are all part of one team.  This team includes your primary Cardiologist  (physician) and Advanced Practice Providers or APPs (Physician Assistants and Nurse Practitioners) who all work together to provide you with the care you need, when you need it.  Your next appointment:   1 year(s)  Provider:    Dr. Liborio  We recommend signing up for the patient portal called MyChart.  Sign up information is provided on this After Visit Summary.  MyChart is used to connect with patients for Virtual Visits (Telemedicine).  Patients are able to view lab/test results, encounter notes, upcoming appointments, etc.  Non-urgent messages can be sent to your provider as well.   To learn more about what you can do with MyChart, go to [email or web address].   Other Instructions

## 2024-04-08 NOTE — Progress Notes (Signed)
 Cardiology Consultation:    Date:  04/08/2024   ID:  Robert Crane, DOB September 10, 2000, MRN [id number]  PCP:  Alba Sharper, MD  Cardiologist:  Alean SAUNDERS Cortnee Steinmiller, MD   Referring MD: Anders Otto DASEN, MD   No chief complaint on file.    ASSESSMENT AND PLAN:   Mr. Robert Crane 23 year old male with history of ADD during school years, previously diagnosed with dysautonomia and was following up with pediatric cardiac electrophysiologist at [facility] until July 2021.  Was noted to have mild tricuspid regurgitation on echocardiograms and had been having follow-up echocardiograms last ultrasound was October 2016.  Recently for hypersomnolence was started on modafinil  and has been noticing improvement in his symptoms. Here to establish care with cardiologist given his history of dysautonomia and tells me he has been off medications that were made for his dysautonomia for the past 3 to 4 years.  [Was previously taking propranolol, returning, midodrine].  Problem List Items Addressed This Visit     Dysautonomia (HCC) - Primary   Blood pressures have been stable here in the office today without any significant orthostatic changes. Supine 118/71 mmHg pulse 61/min Sitting 111/73 mmHg, pulse 60/min Standing 107/74 mmHg, pulse 78/min.  He reassured him about the findings. Advised him to keep himself well-hydrated and in have snacks at regular times. Advised to avoid heavy meals or heavy fluid consumption. Advised him to avoid any caffeinated drinks, energy drinks. Advised to sit down or immediately lay down on the the onset of symptoms.  Advised him to exercise regularly, ideally supine or recumbent exercises and swimming, along with strength training.  Currently given lack of any major symptoms, continue to follow-up. No medications being added at this time.       Relevant Orders   EKG 12-Lead (Completed)   Tricuspid valve insufficiency   History of mild regurgitation that he was following  up regularly. No follow-up echocardiogram since 2016.  Will obtain transthoracic echocardiogram to rule out any significant cardiac structural and functional abnormalities. Reassured him that mild tricuspid regurgitation is not expected to cause any symptoms.  Purpose of echocardiogram is still rule out any major structural abnormalities.      Relevant Orders   ECHOCARDIOGRAM COMPLETE   Return to clinic tentatively in 1 year or as needed.   History of Present Illness:    Robert Crane is a 23 y.o. male who is being seen today for the evaluation of orthostatic tachycardia at the request of Anders, Kehinde T, MD. Previously following up with [facility] cardiology, last visit was with Dr.Idriss 02/08/2019, pediatric cardiac electrophysiologist  Has history of ADD [was on Ritalin during his school years], reported history of dysautonomia, asthma, mild tricuspid valve regurgitation [last echocardiogram October 2016 at [facility]], hypersomnolence  Prior echocardiogram from 04/22/2015 at [facility] reported normal biventricular function, mild tricuspid regurgitation.  Previously following up with pediatric cardiologist at [facility] and last visit was in July 2020.  Has been maintained on midodrine and Ritalin along with propranolol for postural symptoms of lightheadedness.  As he aged out of follow-ups with pediatric cardiologist and subsequently with insurance issues he has not had a visit with cardiologist in multiple years. Currently works at [employer] full-time.  Living at his mother's place after recent break-up from his partner.  Mentions overall his symptoms have been of getting lightheaded, palpitations and passing out while he was in the school years.  No syncopal episodes multiple years. Reportedly his symptoms were well-controlled on midodrine, Ritalin  and propranolol. Has been off these medications for the past 3 to 4 years. Denies any significant increase in  symptoms. Continues to work full-time.  At times he does get lightheaded on standing changing position and feels a warm flash sensation down his body. Denies any falls. Denies any chest pain, shortness of breath.  Exercises occasionally.  Plays around with his dog outdoors after work.  Uses nicotine vape. Drinks alcohol occasionally on social events. Previously was using marijuana but has quit for the last few months.  Mentions has a half brother with gastrointestinal symptoms but no cardiac concerns.  Past Medical History:  Diagnosis Date   Asthma    POTS (postural orthostatic tachycardia syndrome)    Tricuspid valvular disorder     History reviewed. No pertinent surgical history.  Current Medications: Current Meds  Medication Sig   albuterol  (VENTOLIN  HFA) 108 (90 Base) MCG/ACT inhaler Inhale 1-2 puffs into the lungs every 6 (six) hours as needed for wheezing or shortness of breath.   loperamide  (IMODIUM ) 2 MG capsule Take 2 capsules (4 mg total) by mouth every 6 (six) hours as needed for diarrhea or loose stools.   modafinil  (PROVIGIL ) 100 MG tablet Take 2 pills in the morning, take 1 pill midday, do not take after 3 PM   Multiple Vitamin (MULTIVITAMIN) tablet Take 1 tablet by mouth daily.     Allergies:   Patient has no known allergies.   Social History   Socioeconomic History   Marital status: Married    Spouse name: Not on file   Number of children: Not on file   Years of education: Not on file   Highest education level: Not on file  Occupational History   Not on file  Tobacco Use   Smoking status: Never   Smokeless tobacco: Never  Vaping Use   Vaping status: Some Days   Substances: Nicotine, Flavoring  Substance and Sexual Activity   Alcohol use: Yes    Comment: occ   Drug use: No   Sexual activity: Not Currently  Other Topics Concern   Not on file  Social History Narrative   Not on file   Social Drivers of Health   Financial Resource Strain: Not on  file  Food Insecurity: Not on file  Transportation Needs: Not on file  Physical Activity: Not on file  Stress: Not on file  Social Connections: Not on file     Family History: The patient's family history includes Healthy in his mother. There is no history of Sleep apnea or Narcolepsy. ROS:   Please see the history of present illness.    All 14 point review of systems negative except as described per history of present illness.  EKGs/Labs/Other Studies Reviewed:    The following studies were reviewed today:   EKG:       Recent Labs: No results found for requested labs within last 365 days.  Recent Lipid Panel No results found for: CHOL, TRIG, HDL, CHOLHDL, VLDL, LDLCALC, LDLDIRECT  Physical Exam:    VS:  BP 107/74 (BP Location: Left Arm, Patient Position: Standing, Cuff Size: Normal)   Pulse 78   Ht 6' 1 (1.854 m)   Wt 199 lb 1.3 oz (90.3 kg)   SpO2 97%   BMI 26.27 kg/m     Wt Readings from Last 3 Encounters:  04/08/24 199 lb 1.3 oz (90.3 kg)  11/28/23 197 lb (89.4 kg)  08/07/23 205 lb 12.8 oz (93.4 kg)     GENERAL:  Well nourished, well developed in no acute distress NECK: No JVD; No carotid bruits CARDIAC: RRR, S1 and S2 present, no murmurs, no rubs, no gallops CHEST:  Clear to auscultation without rales, wheezing or rhonchi  Extremities: No pitting pedal edema. Pulses bilaterally symmetric with radial 2+ and dorsalis pedis 2+ NEUROLOGIC:  Alert and oriented x 3  Medication Adjustments/Labs and Tests Ordered: Current medicines are reviewed at length with the patient today.  Concerns regarding medicines are outlined above.  Orders Placed This Encounter  Procedures   EKG 12-Lead   ECHOCARDIOGRAM COMPLETE   No orders of the defined types were placed in this encounter.   Signed, Alean jess Kobus, MD, MPH, [facility]. 04/08/2024 1:52 PM    [facility]

## 2024-04-08 NOTE — Assessment & Plan Note (Signed)
 History of mild regurgitation that he was following up regularly. No follow-up echocardiogram since 2016.  Will obtain transthoracic echocardiogram to rule out any significant cardiac structural and functional abnormalities. Reassured him that mild tricuspid regurgitation is not expected to cause any symptoms.  Purpose of echocardiogram is still rule out any major structural abnormalities.

## 2024-04-08 NOTE — Assessment & Plan Note (Signed)
 Blood pressures have been stable here in the office today without any significant orthostatic changes. Supine 118/71 mmHg pulse 61/min Sitting 111/73 mmHg, pulse 60/min Standing 107/74 mmHg, pulse 78/min.  He reassured him about the findings. Advised him to keep himself well-hydrated and in have snacks at regular times. Advised to avoid heavy meals or heavy fluid consumption. Advised him to avoid any caffeinated drinks, energy drinks. Advised to sit down or immediately lay down on the the onset of symptoms.  Advised him to exercise regularly, ideally supine or recumbent exercises and swimming, along with strength training.  Currently given lack of any major symptoms, continue to follow-up. No medications being added at this time.

## 2024-05-02 ENCOUNTER — Ambulatory Visit (HOSPITAL_BASED_OUTPATIENT_CLINIC_OR_DEPARTMENT_OTHER)
Admission: RE | Admit: 2024-05-02 | Discharge: 2024-05-02 | Disposition: A | Discharge status: Home / Self Care | Source: Ambulatory Visit

## 2024-05-02 DIAGNOSIS — I071 Rheumatic tricuspid insufficiency: Secondary | ICD-10-CM | POA: Diagnosis not present

## 2024-05-02 LAB — ECHOCARDIOGRAM COMPLETE
AR max vel: 2.47 cm2
AV Area VTI: 2.35 cm2
AV Area mean vel: 2.48 cm2
AV Mean grad: 3 mmHg
AV Peak grad: 6.3 mmHg
Ao pk vel: 1.25 m/s
Area-P 1/2: 5.31 cm2
Calc EF: 58.3 %
MV M vel: 4.44 m/s
MV Peak grad: 78.9 mmHg
S' Lateral: 3.7 cm
Single Plane A2C EF: 59.1 %
Single Plane A4C EF: 62.1 %

## 2024-06-03 ENCOUNTER — Ambulatory Visit: Admitting: Neurology

## 2024-07-24 ENCOUNTER — Encounter: Payer: Self-pay | Admitting: Neurology

## 2024-07-24 ENCOUNTER — Ambulatory Visit (INDEPENDENT_AMBULATORY_CARE_PROVIDER_SITE_OTHER): Admitting: Neurology

## 2024-07-24 VITALS — BP 105/62 | HR 68 | Ht 73.0 in | Wt 204.0 lb

## 2024-07-24 DIAGNOSIS — G4711 Idiopathic hypersomnia with long sleep time: Secondary | ICD-10-CM | POA: Diagnosis not present

## 2024-07-24 MED ORDER — MODAFINIL 100 MG PO TABS: ORAL_TABLET | ORAL | 3 refills | Status: AC

## 2024-07-24 NOTE — Progress Notes (Signed)
 Subjective:    Crane ID: Robert Crane is a 24 y.o. male.  HPI    Interim history:    Robert Crane is a 24 year old male with an underlying medical history of ADD or ADHD, POTS, asthma, and tricuspid valve insufficiency, who presents for follow-up consultation of his idiopathic hypersomnolence.  Robert Crane is unaccompanied today.  Robert Crane was last seen in our clinic by Lauraine Born, NP in May 2025, at which time Robert Crane reported benefit from Provigil  but had residual sleepiness.  His Provigil  was increased from 100 mg twice daily to 200 mg in Robert morning and 100 mg midday.  Today, 07/25/2023: Robert Crane reports overall doing quite well, Robert increase in Robert morning dose of modafinil  has really helped.  Robert Crane still gets drowsy from time to time.  Sometimes Robert Crane drinks a couple of servings of caffeine per day, about 8 ounce size.  Robert Crane would like to keep his medication regimen Robert same and knows not to overdo caffeine, generally only drinks 1 cup of coffee in Robert morning.  Robert Crane still works at Robert [employer].  Robert Crane has applied at [employer].  In addition, Robert Crane will have his girlfriend move in with her 2 children soon and this is a big change, they are planning to transition at Robert end of Robert school year to make it easier on Robert kids, she will be moving from [place] .  Robert Crane has no major side effects from Robert Provigil .  Saw his new cardiologist on 04/08/2024 and had a follow-up echocardiogram for his history of mitral valve regurgitation on 05/02/2024.  I reviewed Robert office visit note and Robert test results.  Robert Crane has trivial mitral valve regurgitation.  A follow-up is not scheduled.  Robert Crane is no longer on a beta-blocker or midodrine.   Robert Crane's allergies, current medications, family history, past medical history, past social history, past surgical history and problem list were reviewed and updated as appropriate.    Previously:     08/07/2023: I first met Robert Crane at Robert request of his primary care physician, at  which time Robert Crane reported a 2-year history of excessive daytime somnolence, including sleepiness at Robert wheel.  Robert Crane was advised to proceed with extended sleep testing including a nighttime sleep study with next day nap study.  Robert Crane had a baseline polysomnogram through our office on 05/09/2023 which showed no significant snoring or sleep disordered breathing with an AHI of 0.1/h, O2 nadir 92%.  Robert Crane had a significant increase in sleepiness with a mean sleep latency of 4.1 minutes for 5 naps   with 1 sleep onset REM period achieved.  Of note, his UDS was positive for carboxy THC.  Robert Crane was advised of Robert test results by phone call.   Robert Crane reports feeling about Robert same.  Robert Crane can get through his morning.  Well, Robert Crane does have a 30-minute commute but at work, there are times when Robert Crane has to do desk work where Robert Crane may doze off and someone has to tap Robert Crane on his shoulder.  Robert Crane has not seen cardiology in over 2 years.  Robert Crane has a history of tricuspid insufficiency and POTS.  Robert Crane does not have any new symptoms but would like to get checked out.  Robert Crane had an episode of palpitation this weekend.  Robert Crane used to be on Ritalin for either ADD or ADHD in Robert past as well as Strattera but currently is no longer on these medications.  Robert Crane does not smoke marijuana regularly.  Robert Crane  reports that at Robert time of his marijuana use Robert Crane was in severe pain from his wisdom teeth extractions.  Robert Crane is feeling better now.          09/14/2022: (Robert Crane) reports an approximately 2-year history of uncontrolled sleepiness during Robert day.  Robert Crane reports that Robert Crane has fallen asleep standing up and while talking to someone, even at Robert stove.  Robert Crane has fallen asleep at Robert stoplight but has not had a car accident and denies falling asleep while driving.  His Epworth sleepiness score is 20 out of 24, fatigue severity score is 26 out of 63.  Robert Crane is single and lives with his girlfriend who has not mentioned any apneas or snoring to Robert Crane.  Robert Crane has no children, they have 2 dogs in Robert household.   Robert Crane has fallen asleep letting Robert dogs out.  Robert Crane has no family history of sleepiness.  Robert Crane denies any telltale symptoms of cataplexy, hypnopompic or hypnagogic hallucinations or sleep paralysis episodes.  Robert Crane avoids caffeine because of his heart condition Robert Crane says.  Robert Crane has tried drinking up to 2 cups of coffee but Robert Crane is still sleepy.  Robert Crane tried taking naps but wakes up sleepy and it is hard for Robert Crane to wake up first thing in Robert morning.  Robert Crane has an occasional nocturnal headaches and typically does not take any medicines for headaches.  Bedtime can vary between 11 PM and 1 AM when Robert Crane is working and rise time is around 6 AM when Robert Crane works.  On his days off Robert Crane may sleep till 10 AM, as late as 2 PM.  Robert Crane drinks alcohol occasionally, Robert Crane is a non-smoker of cigarettes, Robert Crane denies smoking any marijuana and does not drink caffeine daily.  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 I reviewed your office note from 08/15/2022.  A few years ago Robert Crane tried melatonin at night for sleep as Robert Crane was having trouble falling asleep, Robert Crane reports that Robert Crane slept too much and felt too sleepy from it.  Robert Crane denies dreaming and naps, Robert Crane denies vivid dreams.  Robert Crane feels that Robert Crane rarely dreams.  His Past Medical History Is Significant For: Past Medical History:  Diagnosis Date   Asthma    POTS (postural orthostatic tachycardia syndrome)    Tricuspid valvular disorder     His Past Surgical History Is Significant For: History reviewed. No pertinent surgical history.  His Family History Is Significant For: Family History  Problem Relation Age of Onset   Healthy Mother    Sleep apnea Neg Hx    Narcolepsy Neg Hx     His Social History Is Significant For: Social History   Socioeconomic History   Marital status: Married    Spouse name: Not on file   Number of children: Not on file   Years of education: Not on file   Highest education level: Not on file  Occupational History   Not on file  Tobacco Use   Smoking status: Never    Smokeless tobacco: Never  Vaping Use   Vaping status: Some Days   Substances: Nicotine, Flavoring  Substance and Sexual Activity   Alcohol use: Yes    Comment: occ   Drug use: No   Sexual activity: Not Currently  Other Topics Concern   Not on file  Social History Narrative   Pt lives with family    Pt works    Social Drivers of Health   Tobacco Use: Low Risk (07/24/2024)  Crane History    Smoking Tobacco Use: Never    Smokeless Tobacco Use: Never    Passive Exposure: Not on file  Financial Resource Strain: Not on file  Food Insecurity: Not on file  Transportation Needs: Not on file  Physical Activity: Not on file  Stress: Not on file  Social Connections: Not on file  Depression (PHQ2-9): Medium Risk (10/07/2022)   Depression (PHQ2-9)    PHQ-2 Score: 5  Alcohol Screen: Not on file  Housing: Not on file  Utilities: Not on file  Health Literacy: Not on file    His Allergies Are:  Allergies[1]:   His Current Medications Are:  Outpatient Encounter Medications as of 07/24/2024  Medication Sig   albuterol  (VENTOLIN  HFA) 108 (90 Base) MCG/ACT inhaler Inhale 1-2 puffs into Robert lungs every 6 (six) hours as needed for wheezing or shortness of breath.   modafinil  (PROVIGIL ) 100 MG tablet Take 2 pills in Robert morning, take 1 pill midday, do not take after 3 PM   Multiple Vitamin (MULTIVITAMIN) tablet Take 1 tablet by mouth daily.   loperamide  (IMODIUM ) 2 MG capsule Take 2 capsules (4 mg total) by mouth every 6 (six) hours as needed for diarrhea or loose stools.   No facility-administered encounter medications on file as of 07/24/2024.  :  Review of Systems:  Out of a complete 14 point review of systems, all are reviewed and negative with Robert exception of these symptoms as listed below:       Review of Systems  Objective:  Neurological Exam  Physical Exam Physical Examination:   Vitals:   07/24/24 0756  BP: 105/62  Pulse: 68    General Examination: Robert Crane  is a very pleasant 24 y.o. male in no acute distress. Robert Crane appears well-developed and well-nourished and well groomed.   HEENT: Normocephalic, atraumatic, pupils are equal, round and reactive to light, extraocular tracking is well-preserved, hearing grossly intact.  Face is symmetric with normal facial animation, speech clear without dysarthria, hypophonia or voice tremor, airway examination reveals no significant mouth dryness, stable findings.  Tongue protrudes centrally and palate elevates symmetrically.  No carotid bruits.   Chest: Clear to auscultation without wheezing, rhonchi or crackles noted.   Heart: S1+S2+0, regular and normal without murmurs, rubs or gallops noted.    Abdomen: Soft, non-tender and non-distended.   Extremities: There is no obvious swelling in Robert distal lower extremities bilaterally.    Skin: Warm and dry without trophic changes noted.    Musculoskeletal: exam reveals no obvious joint deformities.    Neurologically:  Mental status: Robert Crane is awake, alert and oriented in all 4 spheres. His immediate and remote memory, attention, language skills and fund of knowledge are appropriate. There is no evidence of aphasia, agnosia, apraxia or anomia. Speech is clear with normal prosody and enunciation. Thought process is linear. Mood is normal and affect is normal.  Cranial nerves II - XII are as described above under HEENT exam.  Motor exam: Normal bulk, tone in all 4 extremities without any obvious restriction, no obvious action or resting tremor.  Fine motor skills and coordination: grossly intact.  Cerebellar testing: No dysmetria or intention tremor. There is no truncal or gait ataxia.  Sensory exam: intact to light touch in Robert upper and lower extremities.  Gait, station and balance: Robert Crane stands easily. No veering to one side is noted. No leaning to one side is noted. Posture is age-appropriate and stance is narrow based. Gait shows normal stride  length and normal  pace. No problems turning are noted.    Assessment and Plan:  In summary, Dragon Thrush Bord is a very pleasant 24 year old male with an underlying medical history of ADD/ADHD as a child, POTS, asthma, and tricuspid valve insufficiency, who presents for follow-up consultation of his his idiopathic hypersomnolence.  Of note, Robert Crane is baseline polysomnogram through our office on 05/09/2023 showed no significant snoring or sleep disordered breathing with an AHI of 0.1/h, O2 nadir 92%.  Sleep latency was increased at 32 minutes, REM latency is slightly decreased at 60.5 minutes.  His REM percentage was 21.7, which is normal.  His mean sleep latency during his MSLT on 05/10/2023 was 4.1 minutes for 5 naps with 1 sleep onset REM period achieved.  Of note, his UDS was positive for carboxy THC at Robert time.    We talked about Robert importance of lifestyle modification and limiting caffeine and Robert importance of nicotine cessation again today.  Robert Crane is trying to hydrate well and limits his caffeine and is doing well currently with Provigil  200 mg in Robert morning and 100 mg in Robert afternoon.  Over Christmas Robert Crane ran out of his medication as it was in [place]  and could not pick up his refill here.  Robert Crane did drive back without medication and was struggling but did utilize caffeine and was taking breaks.  Robert Crane is strongly advised not to drive when feeling sleepy.  If Robert Crane ever finds himself in a bind with a possibility of being off his medication again because of travel plans, we may be able to send in a short bridge prescription separately to pick up to cover days that Robert Crane is going to be out of town and it is too soon to pick up his regular prescription.    Robert Crane has been on Provigil  since January 2025 and tolerates it.  Robert Crane denies any major side effects.    At this juncture, Robert Crane is advised to follow-up to see our nurse practitioner in about 6 to 8 months, sooner if needed.    I answered all his questions today and Robert Crane was in agreement.     I spent 45 minutes in total face-to-face time and in reviewing records during pre-charting, more than 50% of which was spent in counseling and coordination of care, reviewing test results, reviewing medications and treatment regimen and/or in discussing or reviewing Robert diagnosis of idiopathic hypersomnolence, Robert prognosis and treatment options. Pertinent laboratory and imaging test results that were available during this visit with Robert Crane were reviewed by me and considered in my medical decision making (see chart for details).      [1] No Known Allergies

## 2025-01-22 ENCOUNTER — Ambulatory Visit: Admitting: Neurology
